# Patient Record
Sex: Female | Born: 1960
Health system: Southern US, Community
[De-identification: ages and names within clinical notes are randomized; demographics above are authoritative.]

## PROBLEM LIST (undated history)

## (undated) DIAGNOSIS — F4310 Post-traumatic stress disorder, unspecified: Secondary | ICD-10-CM

## (undated) DIAGNOSIS — F32A Depression, unspecified: Secondary | ICD-10-CM

## (undated) DIAGNOSIS — Z90710 Acquired absence of both cervix and uterus: Secondary | ICD-10-CM

## (undated) DIAGNOSIS — IMO0002 Reserved for concepts with insufficient information to code with codable children: Secondary | ICD-10-CM

## (undated) DIAGNOSIS — F329 Major depressive disorder, single episode, unspecified: Secondary | ICD-10-CM

## (undated) DIAGNOSIS — Z720 Tobacco use: Secondary | ICD-10-CM

## (undated) DIAGNOSIS — Z8659 Personal history of other mental and behavioral disorders: Secondary | ICD-10-CM

## (undated) DIAGNOSIS — T8859XA Other complications of anesthesia, initial encounter: Secondary | ICD-10-CM

## (undated) HISTORY — DX: Acquired absence of both cervix and uterus: Z90.710

## (undated) HISTORY — PX: CHOLECYSTECTOMY: SHX55

## (undated) HISTORY — DX: Tobacco use: Z72.0

## (undated) HISTORY — PX: TUBAL LIGATION: SHX77

## (undated) HISTORY — PX: ABDOMINAL HYSTERECTOMY: SHX81

## (undated) HISTORY — PX: ABLATION ON ENDOMETRIOSIS: SHX5787

## (undated) HISTORY — PX: OOPHORECTOMY: SHX86

## (undated) HISTORY — DX: Personal history of other mental and behavioral disorders: Z86.59

---

## 2011-01-12 ENCOUNTER — Ambulatory Visit (HOSPITAL_BASED_OUTPATIENT_CLINIC_OR_DEPARTMENT_OTHER): Payer: Medicare Other | Admitting: Psychology

## 2011-01-12 DIAGNOSIS — F4310 Post-traumatic stress disorder, unspecified: Secondary | ICD-10-CM

## 2011-01-12 DIAGNOSIS — F332 Major depressive disorder, recurrent severe without psychotic features: Secondary | ICD-10-CM

## 2011-01-22 ENCOUNTER — Encounter (HOSPITAL_COMMUNITY): Payer: Medicare Other | Admitting: Psychology

## 2011-01-22 ENCOUNTER — Encounter (HOSPITAL_BASED_OUTPATIENT_CLINIC_OR_DEPARTMENT_OTHER): Payer: Medicare Other | Admitting: Psychology

## 2011-01-22 DIAGNOSIS — F4310 Post-traumatic stress disorder, unspecified: Secondary | ICD-10-CM

## 2011-02-05 ENCOUNTER — Encounter (HOSPITAL_COMMUNITY): Payer: Medicare Other | Admitting: Psychology

## 2011-02-05 ENCOUNTER — Encounter (HOSPITAL_BASED_OUTPATIENT_CLINIC_OR_DEPARTMENT_OTHER): Payer: Medicare Other | Admitting: Psychology

## 2011-02-05 DIAGNOSIS — F4310 Post-traumatic stress disorder, unspecified: Secondary | ICD-10-CM

## 2011-02-12 ENCOUNTER — Encounter (HOSPITAL_COMMUNITY): Payer: Medicare Other | Admitting: Psychology

## 2011-02-16 ENCOUNTER — Encounter (HOSPITAL_BASED_OUTPATIENT_CLINIC_OR_DEPARTMENT_OTHER): Payer: Medicare Other | Admitting: Psychology

## 2011-02-16 DIAGNOSIS — F4310 Post-traumatic stress disorder, unspecified: Secondary | ICD-10-CM

## 2011-02-23 ENCOUNTER — Encounter (HOSPITAL_BASED_OUTPATIENT_CLINIC_OR_DEPARTMENT_OTHER): Payer: Medicare Other | Admitting: Psychology

## 2011-02-23 DIAGNOSIS — F4310 Post-traumatic stress disorder, unspecified: Secondary | ICD-10-CM

## 2011-03-09 ENCOUNTER — Encounter (HOSPITAL_COMMUNITY): Payer: Medicare Other | Admitting: Psychology

## 2011-03-23 ENCOUNTER — Encounter (HOSPITAL_COMMUNITY): Payer: Medicare Other | Admitting: Psychology

## 2011-05-25 ENCOUNTER — Encounter (INDEPENDENT_AMBULATORY_CARE_PROVIDER_SITE_OTHER): Payer: Medicare Other | Admitting: Psychology

## 2011-05-25 DIAGNOSIS — F4310 Post-traumatic stress disorder, unspecified: Secondary | ICD-10-CM

## 2011-12-19 ENCOUNTER — Emergency Department (INDEPENDENT_AMBULATORY_CARE_PROVIDER_SITE_OTHER)
Admission: EM | Admit: 2011-12-19 | Discharge: 2011-12-19 | Disposition: A | Discharge status: Home / Self Care | Payer: Medicare Other | Source: Home / Self Care | Attending: Family Medicine | Admitting: Family Medicine

## 2011-12-19 ENCOUNTER — Encounter: Payer: Self-pay | Admitting: *Deleted

## 2011-12-19 DIAGNOSIS — H109 Unspecified conjunctivitis: Secondary | ICD-10-CM | POA: Diagnosis not present

## 2011-12-19 DIAGNOSIS — J069 Acute upper respiratory infection, unspecified: Secondary | ICD-10-CM | POA: Diagnosis not present

## 2011-12-19 HISTORY — DX: Reserved for concepts with insufficient information to code with codable children: IMO0002

## 2011-12-19 HISTORY — DX: Post-traumatic stress disorder, unspecified: F43.10

## 2011-12-19 MED ORDER — AZITHROMYCIN 250 MG PO TABS: ORAL_TABLET | ORAL | Status: AC

## 2011-12-19 MED ORDER — POLYMYXIN B-TRIMETHOPRIM 10000-0.1 UNIT/ML-% OP SOLN: 1.0000 [drp] | OPHTHALMIC | Status: AC

## 2011-12-19 NOTE — ED Notes (Signed)
Pt woke up today with both eyes "crusted shut" they are itching burning and feel "grainy."  Pt was exposed to pink eye from her family.
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
## 2011-12-19 NOTE — Discharge Instructions (Signed)
Take Mucinex D (guaifenesin with decongestant) twice daily for congestion, or Mucinex plus Sudafed.  Increase fluid intake, rest. May use Afrin nasal spray (or generic oxymetazoline) twice daily for about 5 days.  Also recommend using saline nasal spray several times daily and saline nasal irrigation (AYR is a common brand) Stop all antihistamines for now, and other non-prescription cough/cold preparations. May take Delsym Cough Suppressant at bedtime for nighttime cough.  Begin Azithromycin if not improving about 5 days or if persistent fever develops.

## 2012-12-26 ENCOUNTER — Encounter: Payer: Self-pay | Admitting: *Deleted

## 2012-12-26 ENCOUNTER — Emergency Department (INDEPENDENT_AMBULATORY_CARE_PROVIDER_SITE_OTHER)
Admission: EM | Admit: 2012-12-26 | Discharge: 2012-12-26 | Disposition: A | Discharge status: Home / Self Care | Payer: Medicare Other | Source: Home / Self Care | Attending: Family Medicine | Admitting: Family Medicine

## 2012-12-26 DIAGNOSIS — L304 Erythema intertrigo: Secondary | ICD-10-CM

## 2012-12-26 DIAGNOSIS — L538 Other specified erythematous conditions: Secondary | ICD-10-CM

## 2012-12-26 HISTORY — DX: Major depressive disorder, single episode, unspecified: F32.9

## 2012-12-26 HISTORY — DX: Depression, unspecified: F32.A

## 2012-12-26 MED ORDER — CICLOPIROX OLAMINE 0.77 % EX CREA: TOPICAL_CREAM | Freq: Two times a day (BID) | CUTANEOUS | Status: DC

## 2012-12-26 MED ORDER — CEPHALEXIN 500 MG PO CAPS: 500.0000 mg | ORAL_CAPSULE | Freq: Three times a day (TID) | ORAL | Status: DC
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
## 2012-12-26 NOTE — ED Notes (Signed)
Pt c/o redness to her LT lower abd x today.

## 2012-12-28 ENCOUNTER — Telehealth: Payer: Self-pay | Admitting: *Deleted

## 2015-09-11 ENCOUNTER — Telehealth: Payer: Self-pay | Admitting: Osteopathic Medicine

## 2015-09-11 ENCOUNTER — Encounter: Payer: Self-pay | Admitting: Osteopathic Medicine

## 2015-09-11 ENCOUNTER — Ambulatory Visit (INDEPENDENT_AMBULATORY_CARE_PROVIDER_SITE_OTHER): Payer: Medicare Other | Admitting: Osteopathic Medicine

## 2015-09-11 VITALS — BP 121/81 | HR 76 | Temp 98.3°F | Ht 64.0 in | Wt 194.0 lb

## 2015-09-11 DIAGNOSIS — Z113 Encounter for screening for infections with a predominantly sexual mode of transmission: Secondary | ICD-10-CM

## 2015-09-11 DIAGNOSIS — H6092 Unspecified otitis externa, left ear: Secondary | ICD-10-CM

## 2015-09-11 DIAGNOSIS — Z114 Encounter for screening for human immunodeficiency virus [HIV]: Secondary | ICD-10-CM

## 2015-09-11 DIAGNOSIS — Z6833 Body mass index (BMI) 33.0-33.9, adult: Secondary | ICD-10-CM

## 2015-09-11 DIAGNOSIS — Z818 Family history of other mental and behavioral disorders: Secondary | ICD-10-CM

## 2015-09-11 DIAGNOSIS — F329 Major depressive disorder, single episode, unspecified: Secondary | ICD-10-CM

## 2015-09-11 DIAGNOSIS — F418 Other specified anxiety disorders: Secondary | ICD-10-CM

## 2015-09-11 DIAGNOSIS — Z8659 Personal history of other mental and behavioral disorders: Secondary | ICD-10-CM | POA: Diagnosis not present

## 2015-09-11 DIAGNOSIS — Z1159 Encounter for screening for other viral diseases: Secondary | ICD-10-CM

## 2015-09-11 DIAGNOSIS — Z79899 Other long term (current) drug therapy: Secondary | ICD-10-CM

## 2015-09-11 DIAGNOSIS — F419 Anxiety disorder, unspecified: Secondary | ICD-10-CM

## 2015-09-11 DIAGNOSIS — Z90710 Acquired absence of both cervix and uterus: Secondary | ICD-10-CM | POA: Diagnosis not present

## 2015-09-11 DIAGNOSIS — Z720 Tobacco use: Secondary | ICD-10-CM

## 2015-09-11 DIAGNOSIS — F32A Depression, unspecified: Secondary | ICD-10-CM

## 2015-09-11 DIAGNOSIS — Z13220 Encounter for screening for lipoid disorders: Secondary | ICD-10-CM

## 2015-09-11 DIAGNOSIS — R7301 Impaired fasting glucose: Secondary | ICD-10-CM

## 2015-09-11 HISTORY — DX: Body mass index (BMI) 33.0-33.9, adult: Z68.33

## 2015-09-11 HISTORY — DX: Personal history of other mental and behavioral disorders: Z86.59

## 2015-09-11 HISTORY — DX: Tobacco use: Z72.0

## 2015-09-11 HISTORY — DX: Acquired absence of both cervix and uterus: Z90.710

## 2015-09-11 HISTORY — DX: Anxiety disorder, unspecified: F41.9

## 2015-09-11 HISTORY — DX: Family history of other mental and behavioral disorders: Z81.8

## 2015-09-11 HISTORY — DX: Depression, unspecified: F32.A

## 2015-09-11 MED ORDER — CIPROFLOXACIN-DEXAMETHASONE 0.3-0.1 % OT SUSP: 4.0000 [drp] | Freq: Two times a day (BID) | OTIC | Status: DC

## 2015-09-11 MED ORDER — NEOMYCIN-POLYMYXIN-HC 3.5-10000-1 OT SOLN: 3.0000 [drp] | Freq: Four times a day (QID) | OTIC | Status: DC

## 2015-09-11 NOTE — Telephone Encounter (Signed)
Pt states the ear drops she was prescribed today were over $100 at the pharmacy, wants to know if there is a cheaper option. Will route.

## 2015-09-11 NOTE — Telephone Encounter (Signed)
Called an alternative, please call patient and let her know, thank you
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
## 2015-09-12 DIAGNOSIS — E669 Obesity, unspecified: Secondary | ICD-10-CM | POA: Diagnosis not present

## 2015-09-12 DIAGNOSIS — Z113 Encounter for screening for infections with a predominantly sexual mode of transmission: Secondary | ICD-10-CM | POA: Diagnosis not present

## 2015-09-12 DIAGNOSIS — Z1159 Encounter for screening for other viral diseases: Secondary | ICD-10-CM | POA: Diagnosis not present

## 2015-09-12 DIAGNOSIS — F341 Dysthymic disorder: Secondary | ICD-10-CM | POA: Diagnosis not present

## 2015-09-12 DIAGNOSIS — F418 Other specified anxiety disorders: Secondary | ICD-10-CM | POA: Diagnosis not present

## 2015-09-12 DIAGNOSIS — Z7189 Other specified counseling: Secondary | ICD-10-CM | POA: Diagnosis not present

## 2015-09-12 DIAGNOSIS — Z13220 Encounter for screening for lipoid disorders: Secondary | ICD-10-CM | POA: Diagnosis not present

## 2015-09-12 DIAGNOSIS — Z79899 Other long term (current) drug therapy: Secondary | ICD-10-CM | POA: Diagnosis not present

## 2015-09-12 DIAGNOSIS — Z6833 Body mass index (BMI) 33.0-33.9, adult: Secondary | ICD-10-CM | POA: Diagnosis not present

## 2015-09-12 LAB — COMPLETE METABOLIC PANEL WITH GFR
ALBUMIN: 3.9 g/dL (ref 3.6–5.1)
ALK PHOS: 75 U/L (ref 33–130)
ALT: 17 U/L (ref 6–29)
AST: 20 U/L (ref 10–35)
BILIRUBIN TOTAL: 0.5 mg/dL (ref 0.2–1.2)
BUN: 22 mg/dL (ref 7–25)
CALCIUM: 9.2 mg/dL (ref 8.6–10.4)
CO2: 27 mmol/L (ref 20–31)
Chloride: 105 mmol/L (ref 98–110)
Creat: 0.77 mg/dL (ref 0.50–1.05)
GFR, EST NON AFRICAN AMERICAN: 88 mL/min (ref >=60)
GFR, Est African American: >89 mL/min (ref >=60)
GLUCOSE: 110 mg/dL — AB (ref 65–99)
POTASSIUM: 4.2 mmol/L (ref 3.5–5.3)
Sodium: 140 mmol/L (ref 135–146)
TOTAL PROTEIN: 6.7 g/dL (ref 6.1–8.1)

## 2015-09-12 LAB — CBC WITH DIFFERENTIAL/PLATELET
Basophils Absolute: 0.1 10*3/uL (ref 0.0–0.1)
Basophils Relative: 1 % (ref 0–1)
Eosinophils Absolute: 0.4 10*3/uL (ref 0.0–0.7)
Eosinophils Relative: 6 % — ABNORMAL HIGH (ref 0–5)
HEMATOCRIT: 41.8 % (ref 36.0–46.0)
HEMOGLOBIN: 13.8 g/dL (ref 12.0–15.0)
LYMPHS ABS: 2.1 10*3/uL (ref 0.7–4.0)
LYMPHS PCT: 30 % (ref 12–46)
MCH: 29.7 pg (ref 26.0–34.0)
MCHC: 33 g/dL (ref 30.0–36.0)
MCV: 89.9 fL (ref 78.0–100.0)
MONO ABS: 0.5 10*3/uL (ref 0.1–1.0)
MONOS PCT: 7 % (ref 3–12)
MPV: 9.9 fL (ref 8.6–12.4)
NEUTROS ABS: 3.9 10*3/uL (ref 1.7–7.7)
NEUTROS PCT: 56 % (ref 43–77)
Platelets: 210 10*3/uL (ref 150–400)
RBC: 4.65 MIL/uL (ref 3.87–5.11)
RDW: 16.2 % — ABNORMAL HIGH (ref 11.5–15.5)
WBC: 7 10*3/uL (ref 4.0–10.5)

## 2015-09-12 LAB — LIPID PANEL
CHOLESTEROL: 240 mg/dL — AB (ref 125–200)
HDL: 77 mg/dL (ref >=46)
LDL CALC: 140 mg/dL — AB (ref <130)
TRIGLYCERIDES: 116 mg/dL (ref <150)
Total CHOL/HDL Ratio: 3.1 Ratio (ref <=5.0)
VLDL: 23 mg/dL (ref <30)

## 2015-09-12 NOTE — Telephone Encounter (Signed)
Left information on Pt's voicemail.

## 2015-09-13 LAB — HIV ANTIBODY (ROUTINE TESTING W REFLEX): HIV 1&2 Ab, 4th Generation: NONREACTIVE

## 2015-09-13 LAB — HEPATITIS C ANTIBODY: HCV AB: NEGATIVE

## 2015-09-13 LAB — TSH: TSH: 2.956 u[IU]/mL (ref 0.350–4.500)

## 2015-09-13 LAB — RPR

## 2015-09-16 ENCOUNTER — Other Ambulatory Visit (HOSPITAL_COMMUNITY)
Admission: RE | Admit: 2015-09-16 | Discharge: 2015-09-16 | Disposition: A | Discharge status: Home / Self Care | Payer: Medicare Other | Source: Ambulatory Visit | Attending: Osteopathic Medicine | Admitting: Osteopathic Medicine

## 2015-09-16 ENCOUNTER — Ambulatory Visit (INDEPENDENT_AMBULATORY_CARE_PROVIDER_SITE_OTHER): Payer: Medicare Other | Admitting: Osteopathic Medicine

## 2015-09-16 ENCOUNTER — Encounter: Payer: Self-pay | Admitting: Osteopathic Medicine

## 2015-09-16 VITALS — BP 108/69 | HR 66 | Wt 190.0 lb

## 2015-09-16 DIAGNOSIS — R7301 Impaired fasting glucose: Secondary | ICD-10-CM | POA: Diagnosis not present

## 2015-09-16 DIAGNOSIS — Z1151 Encounter for screening for human papillomavirus (HPV): Secondary | ICD-10-CM | POA: Insufficient documentation

## 2015-09-16 DIAGNOSIS — Z124 Encounter for screening for malignant neoplasm of cervix: Secondary | ICD-10-CM

## 2015-09-16 DIAGNOSIS — Z1239 Encounter for other screening for malignant neoplasm of breast: Secondary | ICD-10-CM

## 2015-09-16 DIAGNOSIS — Z8619 Personal history of other infectious and parasitic diseases: Secondary | ICD-10-CM

## 2015-09-16 DIAGNOSIS — E559 Vitamin D deficiency, unspecified: Secondary | ICD-10-CM

## 2015-09-16 DIAGNOSIS — Z780 Asymptomatic menopausal state: Secondary | ICD-10-CM | POA: Diagnosis not present

## 2015-09-16 DIAGNOSIS — Z23 Encounter for immunization: Secondary | ICD-10-CM | POA: Diagnosis not present

## 2015-09-16 DIAGNOSIS — Z01419 Encounter for gynecological examination (general) (routine) without abnormal findings: Secondary | ICD-10-CM | POA: Insufficient documentation

## 2015-09-16 DIAGNOSIS — Z0000 Encounter for general adult medical examination without abnormal findings: Secondary | ICD-10-CM | POA: Diagnosis not present

## 2015-09-16 MED ORDER — AMBULATORY NON FORMULARY MEDICATION: Status: DC

## 2015-09-16 NOTE — Addendum Note (Signed)
Addended by: Maryla Morrow on: 09/16/2015 08:50 AM   Modules accepted: Orders
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
## 2015-09-17 ENCOUNTER — Ambulatory Visit (INDEPENDENT_AMBULATORY_CARE_PROVIDER_SITE_OTHER): Payer: Medicare Other

## 2015-09-17 DIAGNOSIS — Z780 Asymptomatic menopausal state: Secondary | ICD-10-CM | POA: Diagnosis not present

## 2015-09-17 DIAGNOSIS — M8580 Other specified disorders of bone density and structure, unspecified site: Secondary | ICD-10-CM | POA: Diagnosis not present

## 2015-09-17 DIAGNOSIS — Z1231 Encounter for screening mammogram for malignant neoplasm of breast: Secondary | ICD-10-CM | POA: Diagnosis not present

## 2015-09-17 LAB — HEMOGLOBIN A1C
Hgb A1c MFr Bld: 5.9 % — ABNORMAL HIGH (ref <5.7)
Mean Plasma Glucose: 123 mg/dL — ABNORMAL HIGH (ref <117)

## 2015-09-17 LAB — VITAMIN D 25 HYDROXY (VIT D DEFICIENCY, FRACTURES): VIT D 25 HYDROXY: 54 ng/mL (ref 30–100)

## 2015-09-19 LAB — CYTOLOGY - PAP

## 2015-10-07 ENCOUNTER — Ambulatory Visit (INDEPENDENT_AMBULATORY_CARE_PROVIDER_SITE_OTHER): Payer: Medicare Other | Admitting: Psychiatry

## 2015-10-07 ENCOUNTER — Encounter (HOSPITAL_COMMUNITY): Payer: Self-pay | Admitting: Psychiatry

## 2015-10-07 VITALS — BP 126/68 | HR 80 | Ht 64.0 in | Wt 195.0 lb

## 2015-10-07 DIAGNOSIS — F332 Major depressive disorder, recurrent severe without psychotic features: Secondary | ICD-10-CM | POA: Diagnosis not present

## 2015-10-07 DIAGNOSIS — F411 Generalized anxiety disorder: Secondary | ICD-10-CM

## 2015-10-07 MED ORDER — OXCARBAZEPINE 150 MG PO TABS: 150.0000 mg | ORAL_TABLET | Freq: Two times a day (BID) | ORAL | Status: DC

## 2015-10-07 MED ORDER — FLUOXETINE HCL 10 MG PO TABS: 20.0000 mg | ORAL_TABLET | Freq: Every day | ORAL | Status: DC
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
## 2015-10-13 DIAGNOSIS — Z1212 Encounter for screening for malignant neoplasm of rectum: Secondary | ICD-10-CM | POA: Diagnosis not present

## 2015-10-13 DIAGNOSIS — Z1211 Encounter for screening for malignant neoplasm of colon: Secondary | ICD-10-CM | POA: Diagnosis not present

## 2015-10-15 NOTE — Addendum Note (Signed)
Addended by: Maryla Morrow on: 10/15/2015 10:31 AM   Modules accepted: Level of Service

## 2015-10-24 ENCOUNTER — Telehealth: Payer: Self-pay | Admitting: Osteopathic Medicine

## 2015-10-24 LAB — COLOGUARD: COLOGUARD: NEGATIVE

## 2015-10-24 NOTE — Telephone Encounter (Signed)
Left message on patient vm with results as noted below. Jessica Lang,CMA

## 2015-10-24 NOTE — Telephone Encounter (Signed)
Please call patient: Cologuard test was negative as screening for colon cancer, we'll need to repeat again in 3 years.

## 2015-10-28 ENCOUNTER — Ambulatory Visit (INDEPENDENT_AMBULATORY_CARE_PROVIDER_SITE_OTHER): Payer: Medicare Other | Admitting: Psychiatry

## 2015-10-28 ENCOUNTER — Encounter (HOSPITAL_COMMUNITY): Payer: Self-pay | Admitting: Psychiatry

## 2015-10-28 ENCOUNTER — Other Ambulatory Visit (HOSPITAL_COMMUNITY): Payer: Self-pay | Admitting: Psychiatry

## 2015-10-28 VITALS — BP 124/76 | HR 86 | Ht 64.0 in | Wt 200.0 lb

## 2015-10-28 DIAGNOSIS — F411 Generalized anxiety disorder: Secondary | ICD-10-CM | POA: Diagnosis not present

## 2015-10-28 DIAGNOSIS — F332 Major depressive disorder, recurrent severe without psychotic features: Secondary | ICD-10-CM

## 2015-10-28 DIAGNOSIS — G4700 Insomnia, unspecified: Secondary | ICD-10-CM | POA: Diagnosis not present

## 2015-10-28 MED ORDER — OXCARBAZEPINE 150 MG PO TABS: 150.0000 mg | ORAL_TABLET | Freq: Three times a day (TID) | ORAL | Status: DC

## 2015-10-28 MED ORDER — FLUOXETINE HCL 20 MG PO TABS: 20.0000 mg | ORAL_TABLET | Freq: Every day | ORAL | Status: DC
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
## 2015-11-10 NOTE — Telephone Encounter (Signed)
Received medication request from [facility] for Prozac 10mg . Per Dr. De Nurse, pt is authorized for a refill Prozac 10mg , #60. Prescription was sent to pharmacy. Pt is schedule for a f/u appt on 11/25/15. Called and informed pt of prescription status. Pt verbalizes understanding.

## 2015-11-25 ENCOUNTER — Ambulatory Visit (INDEPENDENT_AMBULATORY_CARE_PROVIDER_SITE_OTHER): Payer: Medicare Other | Admitting: Psychiatry

## 2015-11-25 ENCOUNTER — Encounter (HOSPITAL_COMMUNITY): Payer: Self-pay | Admitting: Psychiatry

## 2015-11-25 VITALS — BP 124/68 | HR 75 | Ht 64.0 in | Wt 206.0 lb

## 2015-11-25 DIAGNOSIS — F332 Major depressive disorder, recurrent severe without psychotic features: Secondary | ICD-10-CM

## 2015-11-25 DIAGNOSIS — G4700 Insomnia, unspecified: Secondary | ICD-10-CM

## 2015-11-25 DIAGNOSIS — F411 Generalized anxiety disorder: Secondary | ICD-10-CM | POA: Diagnosis not present

## 2015-11-25 MED ORDER — OXCARBAZEPINE 150 MG PO TABS: 150.0000 mg | ORAL_TABLET | Freq: Three times a day (TID) | ORAL | Status: DC

## 2015-11-25 MED ORDER — FLUOXETINE HCL 10 MG PO TABS: 20.0000 mg | ORAL_TABLET | Freq: Every day | ORAL | Status: DC
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
## 2015-12-18 ENCOUNTER — Other Ambulatory Visit (HOSPITAL_COMMUNITY): Payer: Self-pay | Admitting: Psychiatry

## 2015-12-18 NOTE — Telephone Encounter (Signed)
Received medication request from [facility] for Prozac. Per Dr. De Nurse, medication request is denied. Refill was sent to pharmacy on 11/25/15 for Prozac 10mg , # 60 w/ 1 refill. Pt is schedule for a f/u appt on 01/20/16.

## 2015-12-20 ENCOUNTER — Other Ambulatory Visit (HOSPITAL_COMMUNITY): Payer: Self-pay | Admitting: Psychiatry

## 2015-12-22 NOTE — Telephone Encounter (Signed)
[facility] Per Dr. De Nurse, medication request is denied. Rx for Prozac 10mg , #60 was sent to pharmacy on 11/25/15 w/ 1 refill. Pt has request medication refill too early. Pt is schedule for a f/u appt on 01/20/16. Called and informed pt of rx status. Pt verbalizes understanding.

## 2016-01-19 ENCOUNTER — Encounter: Payer: Self-pay | Admitting: Family Medicine

## 2016-01-19 ENCOUNTER — Ambulatory Visit (INDEPENDENT_AMBULATORY_CARE_PROVIDER_SITE_OTHER): Payer: Medicare Other | Admitting: Family Medicine

## 2016-01-19 VITALS — BP 129/81 | HR 73 | Temp 98.6°F | Ht 64.0 in | Wt 224.0 lb

## 2016-01-19 DIAGNOSIS — H60399 Other infective otitis externa, unspecified ear: Secondary | ICD-10-CM | POA: Insufficient documentation

## 2016-01-19 DIAGNOSIS — H60393 Other infective otitis externa, bilateral: Secondary | ICD-10-CM

## 2016-01-19 HISTORY — DX: Other infective otitis externa, unspecified ear: H60.399

## 2016-01-19 MED ORDER — CEFDINIR 300 MG PO CAPS: 300.0000 mg | ORAL_CAPSULE | Freq: Two times a day (BID) | ORAL | Status: DC

## 2016-01-19 MED ORDER — NEOMYCIN-POLYMYXIN-HC 3.5-10000-1 OT SOLN: 3.0000 [drp] | Freq: Four times a day (QID) | OTIC | Status: DC
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
## 2016-01-19 NOTE — Patient Instructions (Signed)
Thank you for coming in today. Use ear drops in both ears.  If not better take the oral antibiotics.   Otitis Externa Otitis externa is a bacterial or fungal infection of the outer ear canal. This is the area from the eardrum to the outside of the ear. Otitis externa is sometimes called "swimmer's ear." CAUSES  Possible causes of infection include:  Swimming in dirty water.  Moisture remaining in the ear after swimming or bathing.  Mild injury (trauma) to the ear.  Objects stuck in the ear (foreign body).  Cuts or scrapes (abrasions) on the outside of the ear. SIGNS AND SYMPTOMS  The first symptom of infection is often itching in the ear canal. Later signs and symptoms may include swelling and redness of the ear canal, ear pain, and yellowish-white fluid (pus) coming from the ear. The ear pain may be worse when pulling on the earlobe. DIAGNOSIS  Your health care provider will perform a physical exam. A sample of fluid may be taken from the ear and examined for bacteria or fungi. TREATMENT  Antibiotic ear drops are often given for 10 to 14 days. Treatment may also include pain medicine or corticosteroids to reduce itching and swelling. HOME CARE INSTRUCTIONS   Apply antibiotic ear drops to the ear canal as prescribed by your health care provider.  Take medicines only as directed by your health care provider.  If you have diabetes, follow any additional treatment instructions from your health care provider.  Keep all follow-up visits as directed by your health care provider. PREVENTION   Keep your ear dry. Use the corner of a towel to absorb water out of the ear canal after swimming or bathing.  Avoid scratching or putting objects inside your ear. This can damage the ear canal or remove the protective wax that lines the canal. This makes it easier for bacteria and fungi to grow.  Avoid swimming in lakes, polluted water, or poorly chlorinated pools.  You may use ear drops made  of rubbing alcohol and vinegar after swimming. Combine equal parts of white vinegar and alcohol in a bottle. Put 3 or 4 drops into each ear after swimming. SEEK MEDICAL CARE IF:   You have a fever.  Your ear is still red, swollen, painful, or draining pus after 3 days.  Your redness, swelling, or pain gets worse.  You have a severe headache.  You have redness, swelling, pain, or tenderness in the area behind your ear. MAKE SURE YOU:   Understand these instructions.  Will watch your condition.  Will get help right away if you are not doing well or get worse.   This information is not intended to replace advice given to you by your health care provider. Make sure you discuss any questions you have with your health care provider.   Document Released: 08/30/2005 Document Revised: 09/20/2014 Document Reviewed: 09/16/2011 Elsevier Interactive Patient Education [employer].

## 2016-01-20 ENCOUNTER — Ambulatory Visit (INDEPENDENT_AMBULATORY_CARE_PROVIDER_SITE_OTHER): Payer: Medicare Other | Admitting: Psychiatry

## 2016-01-20 ENCOUNTER — Encounter (HOSPITAL_COMMUNITY): Payer: Self-pay | Admitting: Psychiatry

## 2016-01-20 VITALS — BP 126/80 | HR 72 | Ht 64.0 in | Wt 222.0 lb

## 2016-01-20 DIAGNOSIS — F332 Major depressive disorder, recurrent severe without psychotic features: Secondary | ICD-10-CM | POA: Diagnosis not present

## 2016-01-20 DIAGNOSIS — F411 Generalized anxiety disorder: Secondary | ICD-10-CM | POA: Diagnosis not present

## 2016-01-20 DIAGNOSIS — G4700 Insomnia, unspecified: Secondary | ICD-10-CM | POA: Diagnosis not present

## 2016-01-20 MED ORDER — FLUOXETINE HCL 10 MG PO TABS: 20.0000 mg | ORAL_TABLET | Freq: Every day | ORAL | Status: DC

## 2016-01-20 MED ORDER — OXCARBAZEPINE 150 MG PO TABS: 150.0000 mg | ORAL_TABLET | Freq: Three times a day (TID) | ORAL | Status: DC
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
## 2016-03-04 ENCOUNTER — Encounter: Payer: Self-pay | Admitting: Emergency Medicine

## 2016-03-04 ENCOUNTER — Emergency Department (INDEPENDENT_AMBULATORY_CARE_PROVIDER_SITE_OTHER)
Admission: EM | Admit: 2016-03-04 | Discharge: 2016-03-04 | Disposition: A | Discharge status: Home / Self Care | Payer: Medicare Other | Source: Home / Self Care | Attending: Family Medicine | Admitting: Family Medicine

## 2016-03-04 ENCOUNTER — Emergency Department (INDEPENDENT_AMBULATORY_CARE_PROVIDER_SITE_OTHER): Payer: Medicare Other

## 2016-03-04 DIAGNOSIS — R0602 Shortness of breath: Secondary | ICD-10-CM

## 2016-03-04 DIAGNOSIS — R600 Localized edema: Secondary | ICD-10-CM

## 2016-03-04 DIAGNOSIS — M7989 Other specified soft tissue disorders: Secondary | ICD-10-CM

## 2016-03-04 MED ORDER — FUROSEMIDE 20 MG PO TABS: 20.0000 mg | ORAL_TABLET | Freq: Every day | ORAL | Status: DC

## 2016-03-04 NOTE — Discharge Instructions (Signed)
Edema Edema is an abnormal buildup of fluids. It is more common in your legs and thighs. Painless swelling of the feet and ankles is more likely as a person ages. It also is common in looser skin, like around your eyes. HOME CARE   Keep the affected body part above the level of the heart while lying down.  Do not sit still or stand for a long time.  Do not put anything right under your knees when you lie down.  Do not wear tight clothes on your upper legs.  Exercise your legs to help the puffiness (swelling) go down.  Wear elastic bandages or support stockings as told by your doctor.  A low-salt diet may help lessen the puffiness.  Only take medicine as told by your doctor. GET HELP IF:  Treatment is not working.  You have heart, liver, or kidney disease and notice that your skin looks puffy or shiny.  You have puffiness in your legs that does not get better when you raise your legs.  You have sudden weight gain for no reason. GET HELP RIGHT AWAY IF:   You have shortness of breath or chest pain.  You cannot breathe when you lie down.  You have pain, redness, or warmth in the areas that are puffy.  You have heart, liver, or kidney disease and get edema all of a sudden.  You have a fever and your symptoms get worse all of a sudden. MAKE SURE YOU:   Understand these instructions.  Will watch your condition.  Will get help right away if you are not doing well or get worse.   This information is not intended to replace advice given to you by your health care provider. Make sure you discuss any questions you have with your health care provider.   Document Released: 02/16/2008 Document Revised: 09/04/2013 Document Reviewed: 06/22/2013 Elsevier Interactive Patient Education 2016 Else[employer]  Peripheral Edema You have swelling in your legs (peripheral edema). This swelling is due to excess accumulation of salt and water in your body. Edema may be a sign of heart, kidney  or liver disease, or a side effect of a medication. It may also be due to problems in the leg veins. Elevating your legs and using special support stockings may be very helpful, if the cause of the swelling is due to poor venous circulation. Avoid long periods of standing, whatever the cause. Treatment of edema depends on identifying the cause. Chips, pretzels, pickles and other salty foods should be avoided. Restricting salt in your diet is almost always needed. Water pills (diuretics) are often used to remove the excess salt and water from your body via urine. These medicines prevent the kidney from reabsorbing sodium. This increases urine flow. Diuretic treatment may also result in lowering of potassium levels in your body. Potassium supplements may be needed if you have to use diuretics daily. Daily weights can help you keep track of your progress in clearing your edema. You should call your caregiver for follow up care as recommended. SEEK IMMEDIATE MEDICAL CARE IF:   You have increased swelling, pain, redness, or heat in your legs.  You develop shortness of breath, especially when lying down.  You develop chest or abdominal pain, weakness, or fainting.  You have a fever.   This information is not intended to replace advice given to you by your health care provider. Make sure you discuss any questions you have with your health care provider.   Document Released:  10/07/2004 Document Revised: 11/22/2011 Document Reviewed: 03/12/2015 Elsevier Interactive Patient Education [employer].
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
## 2016-03-04 NOTE — ED Notes (Signed)
Legs swelling since yesterday and SOB x 2 days, staes she has gained 45 lbs in 6 months since she quit smoking

## 2016-03-08 ENCOUNTER — Encounter: Payer: Self-pay | Admitting: Osteopathic Medicine

## 2016-03-08 ENCOUNTER — Ambulatory Visit (INDEPENDENT_AMBULATORY_CARE_PROVIDER_SITE_OTHER): Payer: Medicare Other | Admitting: Osteopathic Medicine

## 2016-03-08 VITALS — BP 133/80 | HR 76 | Temp 98.1°F | Wt 222.0 lb

## 2016-03-08 DIAGNOSIS — R5382 Chronic fatigue, unspecified: Secondary | ICD-10-CM

## 2016-03-08 DIAGNOSIS — Z79899 Other long term (current) drug therapy: Secondary | ICD-10-CM | POA: Diagnosis not present

## 2016-03-08 DIAGNOSIS — M7989 Other specified soft tissue disorders: Secondary | ICD-10-CM | POA: Diagnosis not present

## 2016-03-08 NOTE — Patient Instructions (Signed)
Due for annual physical next January, 2018. Come see me sooner if any problems!
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
## 2016-03-09 LAB — URINALYSIS
BILIRUBIN URINE: NEGATIVE
GLUCOSE, UA: NEGATIVE
HGB URINE DIPSTICK: NEGATIVE
Ketones, ur: NEGATIVE
LEUKOCYTES UA: NEGATIVE
Nitrite: NEGATIVE
PROTEIN: NEGATIVE
Specific Gravity, Urine: 1.024 (ref 1.001–1.035)
pH: 7.5 (ref 5.0–8.0)

## 2016-03-09 LAB — CBC
HCT: 39.9 % (ref 35.0–45.0)
HEMOGLOBIN: 13 g/dL (ref 11.7–15.5)
MCH: 31 pg (ref 27.0–33.0)
MCHC: 32.6 g/dL (ref 32.0–36.0)
MCV: 95 fL (ref 80.0–100.0)
MPV: 10 fL (ref 7.5–12.5)
PLATELETS: 230 10*3/uL (ref 140–400)
RBC: 4.2 MIL/uL (ref 3.80–5.10)
RDW: 14.8 % (ref 11.0–15.0)
WBC: 6.4 10*3/uL (ref 3.8–10.8)

## 2016-03-09 LAB — COMPLETE METABOLIC PANEL WITH GFR
ALBUMIN: 4.2 g/dL (ref 3.6–5.1)
ALK PHOS: 75 U/L (ref 33–130)
ALT: 33 U/L — ABNORMAL HIGH (ref 6–29)
AST: 27 U/L (ref 10–35)
BILIRUBIN TOTAL: 0.3 mg/dL (ref 0.2–1.2)
BUN: 20 mg/dL (ref 7–25)
CALCIUM: 9 mg/dL (ref 8.6–10.4)
CO2: 27 mmol/L (ref 20–31)
Chloride: 103 mmol/L (ref 98–110)
Creat: 0.78 mg/dL (ref 0.50–1.05)
GFR, EST NON AFRICAN AMERICAN: 87 mL/min (ref >=60)
GLUCOSE: 104 mg/dL — AB (ref 65–99)
POTASSIUM: 4.4 mmol/L (ref 3.5–5.3)
Sodium: 138 mmol/L (ref 135–146)
TOTAL PROTEIN: 6.5 g/dL (ref 6.1–8.1)

## 2016-03-09 LAB — TSH: TSH: 2.86 m[IU]/L

## 2016-03-25 ENCOUNTER — Telehealth (HOSPITAL_COMMUNITY): Payer: Self-pay | Admitting: *Deleted

## 2016-03-25 NOTE — Telephone Encounter (Signed)
Received fax from [facility] requesting a refill on Fluoxteine. Per Dr. De Nurse, refill denied. Medication was escribed to pharmacy on 01/20/16 with 2 refills. Refill request is too early. Pt f/u appt is 04/16/16. Faxed response to [facility] at [phone].

## 2016-04-14 ENCOUNTER — Other Ambulatory Visit (HOSPITAL_COMMUNITY): Payer: Self-pay | Admitting: Psychiatry

## 2016-04-16 ENCOUNTER — Encounter (HOSPITAL_COMMUNITY): Payer: Self-pay | Admitting: Psychiatry

## 2016-04-16 ENCOUNTER — Ambulatory Visit (INDEPENDENT_AMBULATORY_CARE_PROVIDER_SITE_OTHER): Payer: Medicare Other | Admitting: Psychiatry

## 2016-04-16 VITALS — BP 122/68 | HR 72 | Ht 64.0 in | Wt 228.0 lb

## 2016-04-16 DIAGNOSIS — F411 Generalized anxiety disorder: Secondary | ICD-10-CM

## 2016-04-16 DIAGNOSIS — F332 Major depressive disorder, recurrent severe without psychotic features: Secondary | ICD-10-CM | POA: Diagnosis not present

## 2016-04-16 DIAGNOSIS — G4700 Insomnia, unspecified: Secondary | ICD-10-CM | POA: Diagnosis not present

## 2016-04-16 MED ORDER — OXCARBAZEPINE 150 MG PO TABS: 150.0000 mg | ORAL_TABLET | Freq: Three times a day (TID) | ORAL | 2 refills | Status: DC

## 2016-04-16 MED ORDER — FLUOXETINE HCL 10 MG PO TABS: 20.0000 mg | ORAL_TABLET | Freq: Every day | ORAL | 2 refills | Status: DC
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
## 2016-04-16 NOTE — Patient Instructions (Signed)
Will refer for sleep study. History of snoring. And tiredness

## 2016-05-13 ENCOUNTER — Other Ambulatory Visit (HOSPITAL_COMMUNITY): Payer: Self-pay | Admitting: Psychiatry

## 2016-05-14 NOTE — Telephone Encounter (Signed)
Received fax from [facility] requesting a refill for Prozac. Per Dr. De Nurse, refill denied. Medication was escribed to pharmacy on 04/16/16 with 2 refills. Refill request is too early. Pt f/u appt is 07/19/16. Faxed response to [facility] at [phone].

## 2016-06-09 ENCOUNTER — Emergency Department (INDEPENDENT_AMBULATORY_CARE_PROVIDER_SITE_OTHER): Payer: Medicare Other

## 2016-06-09 ENCOUNTER — Encounter: Payer: Self-pay | Admitting: *Deleted

## 2016-06-09 ENCOUNTER — Emergency Department (INDEPENDENT_AMBULATORY_CARE_PROVIDER_SITE_OTHER)
Admission: EM | Admit: 2016-06-09 | Discharge: 2016-06-09 | Disposition: A | Discharge status: Home / Self Care | Payer: Medicare Other | Source: Home / Self Care | Attending: Emergency Medicine | Admitting: Emergency Medicine

## 2016-06-09 DIAGNOSIS — R079 Chest pain, unspecified: Secondary | ICD-10-CM

## 2016-06-09 DIAGNOSIS — R0602 Shortness of breath: Secondary | ICD-10-CM

## 2016-06-09 DIAGNOSIS — J45901 Unspecified asthma with (acute) exacerbation: Secondary | ICD-10-CM

## 2016-06-09 DIAGNOSIS — R059 Cough, unspecified: Secondary | ICD-10-CM

## 2016-06-09 DIAGNOSIS — R062 Wheezing: Secondary | ICD-10-CM

## 2016-06-09 DIAGNOSIS — R05 Cough: Secondary | ICD-10-CM

## 2016-06-09 DIAGNOSIS — R070 Pain in throat: Secondary | ICD-10-CM

## 2016-06-09 MED ORDER — PREDNISONE 10 MG PO TABS: ORAL_TABLET | ORAL | 0 refills | Status: DC

## 2016-06-09 MED ORDER — IPRATROPIUM-ALBUTEROL 0.5-2.5 (3) MG/3ML IN SOLN
3.0000 mL | RESPIRATORY_TRACT | Status: AC
  Administered 2016-06-09: 3 mL via RESPIRATORY_TRACT

## 2016-06-09 MED ORDER — ALBUTEROL SULFATE HFA 108 (90 BASE) MCG/ACT IN AERS: 2.0000 | INHALATION_SPRAY | RESPIRATORY_TRACT | 0 refills | Status: DC | PRN

## 2016-06-09 MED ORDER — METHYLPREDNISOLONE ACETATE 80 MG/ML IJ SUSP
80.0000 mg | Freq: Once | INTRAMUSCULAR | Status: AC
  Administered 2016-06-09: 80 mg via INTRAMUSCULAR
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
## 2016-06-09 NOTE — ED Triage Notes (Signed)
Pt c/o cough and SOB after "running over a puff ball mushroom with my lawn mower 2 days and breathing in the spores".

## 2016-06-12 ENCOUNTER — Emergency Department (INDEPENDENT_AMBULATORY_CARE_PROVIDER_SITE_OTHER)
Admission: EM | Admit: 2016-06-12 | Discharge: 2016-06-12 | Disposition: A | Discharge status: Home / Self Care | Payer: Medicare Other | Source: Home / Self Care | Attending: Family Medicine | Admitting: Family Medicine

## 2016-06-12 ENCOUNTER — Encounter: Payer: Self-pay | Admitting: Emergency Medicine

## 2016-06-12 DIAGNOSIS — J209 Acute bronchitis, unspecified: Secondary | ICD-10-CM | POA: Diagnosis not present

## 2016-06-12 DIAGNOSIS — J45901 Unspecified asthma with (acute) exacerbation: Secondary | ICD-10-CM

## 2016-06-12 MED ORDER — AZITHROMYCIN 250 MG PO TABS: ORAL_TABLET | ORAL | 0 refills | Status: DC

## 2016-06-12 MED ORDER — BENZONATATE 200 MG PO CAPS: 200.0000 mg | ORAL_CAPSULE | Freq: Every day | ORAL | 0 refills | Status: DC

## 2016-06-12 MED ORDER — PREDNISONE 20 MG PO TABS: 20.0000 mg | ORAL_TABLET | Freq: Two times a day (BID) | ORAL | 0 refills | Status: DC

## 2016-06-12 MED ORDER — FLUTICASONE-SALMETEROL 100-50 MCG/DOSE IN AEPB: 1.0000 | INHALATION_SPRAY | Freq: Two times a day (BID) | RESPIRATORY_TRACT | 1 refills | Status: DC

## 2016-06-12 MED ORDER — IPRATROPIUM-ALBUTEROL 0.5-2.5 (3) MG/3ML IN SOLN: 3.0000 mL | Freq: Once | RESPIRATORY_TRACT | Status: DC

## 2016-06-12 NOTE — ED Triage Notes (Signed)
Patient was here for wheezing 3 days ago and it has not improved despite using her rescue inhaler as ordered. She believes condition started after inhaling mushroom spores while mowing grass 3 days ago.

## 2016-06-12 NOTE — Discharge Instructions (Signed)
Begin new prednisone prescription Sunday 06/13/16 (after five days begin taking two ten mg tabs once daily for 3 days). Take plain guaifenesin (1200mg  extended release tabs such as Mucinex) twice daily, with plenty of water, for cough and congestion.  May add Pseudoephedrine (30mg , one or two every 4 to 6 hours) for sinus congestion.  Get adequate rest.   May use Afrin nasal spray (or generic oxymetazoline) twice daily for about 5 days and then discontinue.  Also recommend using saline nasal spray several times daily and saline nasal irrigation (AYR is a common brand).  Use Flonase nasal spray each morning after using Afrin nasal spray and saline nasal irrigation. Try warm salt water gargles for sore throat.  Stop all antihistamines for now, and other non-prescription cough/cold preparations. Continue albuterol inhaler. Follow-up with family doctor.
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
## 2016-06-17 ENCOUNTER — Ambulatory Visit (INDEPENDENT_AMBULATORY_CARE_PROVIDER_SITE_OTHER): Payer: Medicare Other | Admitting: Osteopathic Medicine

## 2016-06-17 ENCOUNTER — Encounter: Payer: Self-pay | Admitting: Osteopathic Medicine

## 2016-06-17 ENCOUNTER — Ambulatory Visit (INDEPENDENT_AMBULATORY_CARE_PROVIDER_SITE_OTHER): Payer: Medicare Other

## 2016-06-17 VITALS — BP 117/95 | HR 75 | Temp 98.4°F | Ht 64.0 in | Wt 234.0 lb

## 2016-06-17 DIAGNOSIS — Z8709 Personal history of other diseases of the respiratory system: Secondary | ICD-10-CM | POA: Diagnosis not present

## 2016-06-17 DIAGNOSIS — R05 Cough: Secondary | ICD-10-CM | POA: Diagnosis not present

## 2016-06-17 DIAGNOSIS — J984 Other disorders of lung: Secondary | ICD-10-CM | POA: Diagnosis not present

## 2016-06-17 DIAGNOSIS — I700 Atherosclerosis of aorta: Secondary | ICD-10-CM | POA: Diagnosis not present

## 2016-06-17 DIAGNOSIS — R059 Cough, unspecified: Secondary | ICD-10-CM

## 2016-06-17 MED ORDER — GUAIFENESIN-CODEINE 100-10 MG/5ML PO SYRP: 5.0000 mL | ORAL_SOLUTION | Freq: Three times a day (TID) | ORAL | 0 refills | Status: DC | PRN

## 2016-06-17 MED ORDER — NEBULIZER/TUBING/MOUTHPIECE KIT: PACK | 1 refills | Status: DC

## 2016-06-17 MED ORDER — NEBULIZER DEVI: 1 refills | Status: DC
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
## 2016-06-17 NOTE — Patient Instructions (Signed)
It can be difficult to determine whether your persistent cough is due to lingering inflammation from an asthma exacerbation or viral bronchitis, postnasal drip/irritation from an upper respiratory infection or allergic illness, or something else. If not getting better, may need to do further testing.  Chest x-ray is reassuring that there doesn't look like any pneumonia or rib fracture, a radiologist will look at these images and I will call you if they say anything different about it.   In the meantime, continue the prednisone taper that you're on, continue the inhalers that you're taking. Continue the cough medication that was given to you by urgent care, I have added an additional cough syrup to take in the evening when you're having difficulty sleeping. Otherwise, look for lozenges with benzocaine and menthol in these to help numb the throat and reduce irritation, can use as much honey as you want, herbal teas with elm bark, marshmallow root, licorice root are a bit more helpful in coding the throat to reduce irritation.  Any questions or concerns, or if your cough is getting worse particularly if there is also a fever or productive cough, or if you are not experiencing any improvement over the course of the next 1-2 weeks, please return to the office for further evaluation.

## 2016-06-18 ENCOUNTER — Telehealth: Payer: Self-pay

## 2016-06-18 MED ORDER — IPRATROPIUM-ALBUTEROL 0.5-2.5 (3) MG/3ML IN SOLN: 3.0000 mL | RESPIRATORY_TRACT | 1 refills | Status: DC | PRN

## 2016-06-18 NOTE — Telephone Encounter (Signed)
Patient called stated that she got the Rx for Nebulizer machine but she did not get a Rx for the neb solution. Please advise patient request a Rx for Nebulizer solution sent to [facility]. Jessica Lang

## 2016-06-18 NOTE — Telephone Encounter (Signed)
Rx sent to pharmacy   

## 2016-06-21 ENCOUNTER — Telehealth: Payer: Self-pay | Admitting: *Deleted

## 2016-06-21 DIAGNOSIS — J449 Chronic obstructive pulmonary disease, unspecified: Secondary | ICD-10-CM

## 2016-06-21 NOTE — Telephone Encounter (Signed)
Submitted PA for gen Duoneb Key: [id number] - PA Case ID: [id number]

## 2016-06-28 ENCOUNTER — Other Ambulatory Visit: Payer: Self-pay | Admitting: Osteopathic Medicine

## 2016-06-28 DIAGNOSIS — J441 Chronic obstructive pulmonary disease with (acute) exacerbation: Secondary | ICD-10-CM | POA: Insufficient documentation

## 2016-06-28 DIAGNOSIS — J209 Acute bronchitis, unspecified: Secondary | ICD-10-CM

## 2016-06-28 DIAGNOSIS — J849 Interstitial pulmonary disease, unspecified: Secondary | ICD-10-CM | POA: Insufficient documentation

## 2016-06-28 HISTORY — DX: Interstitial pulmonary disease, unspecified: J84.9

## 2016-06-28 HISTORY — DX: Chronic obstructive pulmonary disease with (acute) exacerbation: J44.1

## 2016-06-28 HISTORY — DX: Acute bronchitis, unspecified: J20.9

## 2016-06-28 MED ORDER — IPRATROPIUM-ALBUTEROL 0.5-2.5 (3) MG/3ML IN SOLN: 3.0000 mL | RESPIRATORY_TRACT | 1 refills | Status: DC | PRN

## 2016-06-28 NOTE — Progress Notes (Signed)
J20.9, J84.9, J44.1  Receives noticed that DuoNeb medication was denied, recent prescription with diagnostic codes associated per pharmacy request/instructions this was the problem

## 2016-07-01 NOTE — Telephone Encounter (Signed)
Upon f/u found that duoneb was : keyDeniedon October 9  Your request has been denied. Patient has rescue inhalers however perhaps you would like to send another neb solution since this was denied for future and since she does have the neb machine ?

## 2016-07-05 ENCOUNTER — Other Ambulatory Visit (HOSPITAL_COMMUNITY): Payer: Self-pay | Admitting: Psychiatry

## 2016-07-07 DIAGNOSIS — J449 Chronic obstructive pulmonary disease, unspecified: Secondary | ICD-10-CM

## 2016-07-07 HISTORY — DX: Chronic obstructive pulmonary disease, unspecified: J44.9

## 2016-07-07 NOTE — Telephone Encounter (Signed)
I'm not sure I ordered it under the correct diagnosis. Can we put DuoNeb under the following diagnoses if that will fix the PA?  J84.9 chronic interstitial lung disease J44.9 COPD J44.1 COPD exacerbation

## 2016-07-08 NOTE — Telephone Encounter (Signed)
Medication refill- received fax from [facility] requesting a refill for Trileptal. Per Dr. De Nurse, refill is authorized for Trileptal 150mg , #90. Rx was sent to pharmacy. Pt f/u appt is schedule for 11/6. Called and informed pt of refill status. Pt verbalizes understand.

## 2016-07-14 NOTE — Telephone Encounter (Signed)
When I did the PA one of the questions asked if patient needed the med for specific dx and it included several dx codes. I answered yes to question in regard to the diagnosis.  Does the patient require albuterol, arformoterol, budesonide, cromolyn, formoterol, ipratropium, ipratropium/albuterol, or levalbuterol for the management of asthma or obstructive pulmonary disease (ICD-10 diagnosis codes J41.0, J41.1, J41.8, J42, J43.0-J43.2, J43.8, J43.9, J44.0, J44.1, J44.9, J45.20-J45.22, J45.30-J45.32, J45.40-J45.42, J45.50-J45.52, J45.901, J45.902, J45.909, J45.990, J45.991, J45.998, J47.0, J47.1, J47.9, J60, J61, J62.0, J62.8, J63.0-J63.6, J64, J65, J66.0-J66.2, J66.8, J67.0-J67.9, J68.0-J68.4, J68.8, J68.9, J69.0, J69.1, J69.8, J70.0-J70.5, J70.8, J70.9 or ICD-9 diagnosis codes 491.0 - 508.9)? Tech Note: Process thru part D if the following exist: IF THE PATIENT TOOK THE REQUESTED MEDICATION ON HIS OWN IN AN OUTPATIENT SETTING (E.G. EMERGENCY ROOM, URGENT CARE FACILITY) FOR A REASON NOT RELATED TO THE VISIT, APPLY ONE TIME APPROVAL FOR DATE OF FILL.)   the only diagnosis that was not  included was j84.9 and an appeal sent through covermymed

## 2016-07-19 ENCOUNTER — Encounter (HOSPITAL_COMMUNITY): Payer: Self-pay | Admitting: Psychiatry

## 2016-07-19 ENCOUNTER — Ambulatory Visit (INDEPENDENT_AMBULATORY_CARE_PROVIDER_SITE_OTHER): Payer: Medicare Other | Admitting: Psychiatry

## 2016-07-19 ENCOUNTER — Encounter: Payer: Self-pay | Admitting: Osteopathic Medicine

## 2016-07-19 DIAGNOSIS — Z833 Family history of diabetes mellitus: Secondary | ICD-10-CM

## 2016-07-19 DIAGNOSIS — F332 Major depressive disorder, recurrent severe without psychotic features: Secondary | ICD-10-CM

## 2016-07-19 DIAGNOSIS — F5102 Adjustment insomnia: Secondary | ICD-10-CM

## 2016-07-19 DIAGNOSIS — F411 Generalized anxiety disorder: Secondary | ICD-10-CM | POA: Diagnosis not present

## 2016-07-19 DIAGNOSIS — Z87891 Personal history of nicotine dependence: Secondary | ICD-10-CM

## 2016-07-19 DIAGNOSIS — Z8249 Family history of ischemic heart disease and other diseases of the circulatory system: Secondary | ICD-10-CM

## 2016-07-19 DIAGNOSIS — Z8489 Family history of other specified conditions: Secondary | ICD-10-CM

## 2016-07-19 MED ORDER — FLUOXETINE HCL 20 MG PO CAPS: 20.0000 mg | ORAL_CAPSULE | Freq: Every day | ORAL | 2 refills | Status: DC

## 2016-07-19 MED ORDER — OXCARBAZEPINE 150 MG PO TABS: 150.0000 mg | ORAL_TABLET | Freq: Three times a day (TID) | ORAL | 2 refills | Status: DC
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
## 2016-07-30 NOTE — Telephone Encounter (Signed)
Thi

## 2016-08-03 ENCOUNTER — Encounter: Payer: Self-pay | Admitting: *Deleted

## 2016-08-03 NOTE — Telephone Encounter (Signed)
Upon f/u duoneb was denied because the medication was submitted under Medicare Part D. The medication needs to be submitted by the pharmacist under medicare part B. Since the patient is using the medication in a nebulizer in her home then it will be covered by medicare part B. I called and left this on the pharmacy voicemail. Medicare part D cant cover a drug  that is covered by Medicare Part B. Called the pharmacy and left this on their vm and also faxed to them the letter of explanation. Notified the patient as well.

## 2016-08-26 ENCOUNTER — Encounter: Payer: Self-pay | Admitting: Family Medicine

## 2016-08-26 ENCOUNTER — Ambulatory Visit (INDEPENDENT_AMBULATORY_CARE_PROVIDER_SITE_OTHER): Payer: Medicare Other | Admitting: Family Medicine

## 2016-08-26 VITALS — BP 126/88 | HR 79 | Wt 229.0 lb

## 2016-08-26 DIAGNOSIS — J849 Interstitial pulmonary disease, unspecified: Secondary | ICD-10-CM

## 2016-08-26 DIAGNOSIS — J441 Chronic obstructive pulmonary disease with (acute) exacerbation: Secondary | ICD-10-CM | POA: Diagnosis not present

## 2016-08-26 MED ORDER — PREDNISONE 50 MG PO TABS: 50.0000 mg | ORAL_TABLET | Freq: Every day | ORAL | 0 refills | Status: DC

## 2016-08-26 MED ORDER — BUDESONIDE 0.25 MG/2ML IN SUSP: 0.2500 mg | Freq: Two times a day (BID) | RESPIRATORY_TRACT | 12 refills | Status: DC

## 2016-08-26 MED ORDER — AZITHROMYCIN 250 MG PO TABS: 250.0000 mg | ORAL_TABLET | Freq: Every day | ORAL | 0 refills | Status: DC

## 2016-08-26 NOTE — Patient Instructions (Signed)
Thank you for coming in today. Take prednisone and azithromycin daily. Try using the inhaled steroid through the nebulizer if it's affordable. Follow up with Dr. Sheppard Coil soon Call or go to the emergency room if you get worse, have trouble breathing, have chest pains, or palpitations.     Budesonide inhalation solution What is this medicine? BUDESONIDE (bue DES oh nide) is a corticosteroid. It helps decrease inflammation in your lungs. This medicine is used to treat the symptoms of asthma. Never use this medicine for an acute asthma attack. This medicine may be used for other purposes; ask your health care provider or pharmacist if you have questions. COMMON BRAND NAME(S): Pulmicort What should I tell my health care provider before I take this medicine? They need to know if you have any of these conditions: -bone problems -glaucoma -immune system problems -infection, like chickenpox, tuberculosis, herpes, or fungal infection -recent surgery or injury of the mouth or throat -taking corticosteroids by mouth -an unusual or allergic reaction to budesonide, steroids, other medicines, foods, dyes, or preservatives -pregnant or trying to get pregnant -breast-feeding How should I use this medicine? This medicine is used in a nebulizer. Nebulizers make a liquid into an aerosol that you breathe in through your mouth or your mouth and nose into your lungs. You will be taught how to use your nebulizer. Rinse your mouth with water after use. Follow the directions on your prescription label. Do not mix this medicine with other medicines in your nebulizer. Do not use more often than directed. Talk to your pediatrician regarding the use of this medicine in children. Special care may be needed. Overdosage: If you think you have taken too much of this medicine contact a poison control center or emergency room at once. NOTE: This medicine is only for you. Do not share this medicine with others. What if I  miss a dose? If you miss a dose, use it as soon as you remember. If it is almost time for your next dose, use only that dose and continue with your regular schedule, spacing doses evenly. Do not use double or extra doses. What may interact with this medicine? Do not take this medicine with any of the following medications: -mifepristone This medicine may also interact with the following medications: -cimetidine -clarithromycin -erythromycin -itraconazole -ketoconazole -some vaccinations This list may not describe all possible interactions. Give your health care provider a list of all the medicines, herbs, non-prescription drugs, or dietary supplements you use. Also tell them if you smoke, drink alcohol, or use illegal drugs. Some items may interact with your medicine. What should I watch for while using this medicine? Visit your doctor or health care professional for regular checks on your progress. Check with your health care professional if your symptoms do not improve. If your symptoms get worse or if you need your short acting inhalers more often, call your doctor right away. The medicine may increase your risk of getting an infection. Stay away from people who are sick. Tell your doctor or health care professional if you are around anyone with measles or chickenpox. What side effects may I notice from receiving this medicine? Side effects that you should report to your doctor or health care professional as soon as possible: -allergic reactions like skin rash, itching or hives, swelling of the face, lips, or tongue -breathing problems -changes in vision -unusual swelling -white patches or sores in the mouth or throat Side effects that usually do not require medical attention (report to  your doctor or health care professional if they continue or are bothersome): -coughing, hoarseness -headache -runny nose -stomach upset This list may not describe all possible side effects. Call your  doctor for medical advice about side effects. You may report side effects to FDA at 1-800-FDA-1088. Where should I keep my medicine? Keep out of the reach of children. Store at a room temperature between 20 and 25 degrees C (68 and 77 degrees F). Do not refrigerate or freeze. Keep unopened vials in the foil pouch. When the package has been opened, the shelf life of the unused medicine is 2 weeks when protected from light. Unused medicine should be returned to the aluminum foil envelope right away to protect them from light. Throw away any unused medicine after the expiration date. NOTE: This sheet is a summary. It may not cover all possible information. If you have questions about this medicine, talk to your doctor, pharmacist, or health care provider.  2017 Elsevier/Gold Standard (2013-02-15 11:10:36)
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
## 2016-10-05 ENCOUNTER — Other Ambulatory Visit: Payer: Self-pay

## 2016-10-05 DIAGNOSIS — J849 Interstitial pulmonary disease, unspecified: Secondary | ICD-10-CM

## 2016-10-05 DIAGNOSIS — J441 Chronic obstructive pulmonary disease with (acute) exacerbation: Secondary | ICD-10-CM

## 2016-10-05 MED ORDER — BUDESONIDE 0.25 MG/2ML IN SUSP: 0.2500 mg | Freq: Two times a day (BID) | RESPIRATORY_TRACT | 12 refills | Status: DC

## 2016-10-12 ENCOUNTER — Ambulatory Visit (INDEPENDENT_AMBULATORY_CARE_PROVIDER_SITE_OTHER): Payer: Medicare Other | Admitting: Psychiatry

## 2016-10-12 ENCOUNTER — Encounter (HOSPITAL_COMMUNITY): Payer: Self-pay | Admitting: Psychiatry

## 2016-10-12 VITALS — BP 116/72 | HR 85 | Resp 16 | Ht 64.0 in | Wt 218.0 lb

## 2016-10-12 DIAGNOSIS — Z9889 Other specified postprocedural states: Secondary | ICD-10-CM | POA: Diagnosis not present

## 2016-10-12 DIAGNOSIS — F5102 Adjustment insomnia: Secondary | ICD-10-CM | POA: Diagnosis not present

## 2016-10-12 DIAGNOSIS — F411 Generalized anxiety disorder: Secondary | ICD-10-CM

## 2016-10-12 DIAGNOSIS — Z833 Family history of diabetes mellitus: Secondary | ICD-10-CM

## 2016-10-12 DIAGNOSIS — F332 Major depressive disorder, recurrent severe without psychotic features: Secondary | ICD-10-CM | POA: Diagnosis not present

## 2016-10-12 DIAGNOSIS — Z9049 Acquired absence of other specified parts of digestive tract: Secondary | ICD-10-CM

## 2016-10-12 DIAGNOSIS — Z818 Family history of other mental and behavioral disorders: Secondary | ICD-10-CM

## 2016-10-12 DIAGNOSIS — Z90710 Acquired absence of both cervix and uterus: Secondary | ICD-10-CM

## 2016-10-12 DIAGNOSIS — F17210 Nicotine dependence, cigarettes, uncomplicated: Secondary | ICD-10-CM

## 2016-10-12 MED ORDER — OXCARBAZEPINE 150 MG PO TABS: 150.0000 mg | ORAL_TABLET | Freq: Three times a day (TID) | ORAL | 2 refills | Status: DC

## 2016-10-12 MED ORDER — FLUOXETINE HCL 20 MG PO CAPS: 20.0000 mg | ORAL_CAPSULE | Freq: Every day | ORAL | 2 refills | Status: DC
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
## 2016-11-30 ENCOUNTER — Encounter: Payer: Self-pay | Admitting: Emergency Medicine

## 2016-11-30 ENCOUNTER — Emergency Department (INDEPENDENT_AMBULATORY_CARE_PROVIDER_SITE_OTHER)
Admission: EM | Admit: 2016-11-30 | Discharge: 2016-11-30 | Disposition: A | Discharge status: Home / Self Care | Payer: Medicare Other | Source: Home / Self Care | Attending: Family Medicine | Admitting: Family Medicine

## 2016-11-30 DIAGNOSIS — J4521 Mild intermittent asthma with (acute) exacerbation: Secondary | ICD-10-CM

## 2016-11-30 DIAGNOSIS — J209 Acute bronchitis, unspecified: Secondary | ICD-10-CM

## 2016-11-30 MED ORDER — AZITHROMYCIN 250 MG PO TABS: 250.0000 mg | ORAL_TABLET | Freq: Every day | ORAL | 0 refills | Status: DC

## 2016-11-30 MED ORDER — IPRATROPIUM-ALBUTEROL 0.5-2.5 (3) MG/3ML IN SOLN
3.0000 mL | Freq: Once | RESPIRATORY_TRACT | Status: AC
  Administered 2016-11-30: 3 mL via RESPIRATORY_TRACT

## 2016-11-30 MED ORDER — METHYLPREDNISOLONE SODIUM SUCC 40 MG IJ SOLR
80.0000 mg | Freq: Once | INTRAMUSCULAR | Status: AC
  Administered 2016-11-30: 80 mg via INTRAMUSCULAR

## 2016-11-30 MED ORDER — PREDNISONE 20 MG PO TABS: ORAL_TABLET | ORAL | 0 refills | Status: DC

## 2016-11-30 MED ORDER — BENZONATATE 100 MG PO CAPS: 100.0000 mg | ORAL_CAPSULE | Freq: Three times a day (TID) | ORAL | 0 refills | Status: DC

## 2016-11-30 NOTE — Discharge Instructions (Signed)
°  You were given a shot of solumedrol (a steroid) today to help with inflammation in your lungs to help you breath better and to help with your cough.  You have been prescribed 5 days of prednisone, an oral steroid.  You may start this medication tomorrow with breakfast.   ° °

## 2016-11-30 NOTE — ED Triage Notes (Signed)
Patient reports cough for about 10 days; non-productive; not relieved by use of inhaler nor nebulizer.
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
## 2016-12-16 DIAGNOSIS — J40 Bronchitis, not specified as acute or chronic: Secondary | ICD-10-CM | POA: Diagnosis not present

## 2017-01-04 ENCOUNTER — Encounter (HOSPITAL_COMMUNITY): Payer: Self-pay | Admitting: Psychiatry

## 2017-01-04 ENCOUNTER — Ambulatory Visit (INDEPENDENT_AMBULATORY_CARE_PROVIDER_SITE_OTHER): Payer: Medicare Other | Admitting: Psychiatry

## 2017-01-04 VITALS — BP 124/74 | HR 70 | Resp 16 | Ht 64.0 in | Wt 194.0 lb

## 2017-01-04 DIAGNOSIS — F17210 Nicotine dependence, cigarettes, uncomplicated: Secondary | ICD-10-CM

## 2017-01-04 DIAGNOSIS — Z818 Family history of other mental and behavioral disorders: Secondary | ICD-10-CM

## 2017-01-04 DIAGNOSIS — F5102 Adjustment insomnia: Secondary | ICD-10-CM | POA: Diagnosis not present

## 2017-01-04 DIAGNOSIS — F332 Major depressive disorder, recurrent severe without psychotic features: Secondary | ICD-10-CM | POA: Diagnosis not present

## 2017-01-04 DIAGNOSIS — Z79899 Other long term (current) drug therapy: Secondary | ICD-10-CM

## 2017-01-04 DIAGNOSIS — F411 Generalized anxiety disorder: Secondary | ICD-10-CM | POA: Diagnosis not present

## 2017-01-04 MED ORDER — FLUOXETINE HCL 20 MG PO CAPS: 20.0000 mg | ORAL_CAPSULE | Freq: Every day | ORAL | 3 refills | Status: DC

## 2017-01-04 MED ORDER — OXCARBAZEPINE 150 MG PO TABS: 150.0000 mg | ORAL_TABLET | Freq: Three times a day (TID) | ORAL | 3 refills | Status: DC
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
## 2017-04-26 ENCOUNTER — Encounter (HOSPITAL_COMMUNITY): Payer: Self-pay | Admitting: Psychiatry

## 2017-04-26 ENCOUNTER — Ambulatory Visit (INDEPENDENT_AMBULATORY_CARE_PROVIDER_SITE_OTHER): Payer: Medicare Other | Admitting: Psychiatry

## 2017-04-26 VITALS — BP 112/68 | HR 66 | Resp 16 | Ht 64.0 in | Wt 177.0 lb

## 2017-04-26 DIAGNOSIS — F332 Major depressive disorder, recurrent severe without psychotic features: Secondary | ICD-10-CM

## 2017-04-26 DIAGNOSIS — F5102 Adjustment insomnia: Secondary | ICD-10-CM | POA: Diagnosis not present

## 2017-04-26 DIAGNOSIS — Z818 Family history of other mental and behavioral disorders: Secondary | ICD-10-CM | POA: Diagnosis not present

## 2017-04-26 DIAGNOSIS — F411 Generalized anxiety disorder: Secondary | ICD-10-CM

## 2017-04-26 DIAGNOSIS — F17210 Nicotine dependence, cigarettes, uncomplicated: Secondary | ICD-10-CM

## 2017-04-26 MED ORDER — OXCARBAZEPINE 150 MG PO TABS: 150.0000 mg | ORAL_TABLET | Freq: Three times a day (TID) | ORAL | 3 refills | Status: DC

## 2017-04-26 MED ORDER — FLUOXETINE HCL 20 MG PO CAPS: 20.0000 mg | ORAL_CAPSULE | Freq: Every day | ORAL | 3 refills | Status: DC
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
## 2017-08-08 ENCOUNTER — Telehealth (HOSPITAL_COMMUNITY): Payer: Self-pay | Admitting: Psychiatry

## 2017-08-08 MED ORDER — OXCARBAZEPINE 150 MG PO TABS: 150.0000 mg | ORAL_TABLET | Freq: Three times a day (TID) | ORAL | 0 refills | Status: DC

## 2017-08-08 NOTE — Telephone Encounter (Signed)
Pt needs refill on trileptral sent to [facility] on [address] in [place].

## 2017-08-08 NOTE — Telephone Encounter (Signed)
sent 

## 2017-08-16 ENCOUNTER — Encounter (HOSPITAL_COMMUNITY): Payer: Self-pay | Admitting: Psychiatry

## 2017-08-16 ENCOUNTER — Ambulatory Visit (INDEPENDENT_AMBULATORY_CARE_PROVIDER_SITE_OTHER): Payer: Medicare Other | Admitting: Psychiatry

## 2017-08-16 ENCOUNTER — Other Ambulatory Visit: Payer: Self-pay

## 2017-08-16 VITALS — BP 106/80 | HR 68 | Ht 64.0 in | Wt 198.0 lb

## 2017-08-16 DIAGNOSIS — F17210 Nicotine dependence, cigarettes, uncomplicated: Secondary | ICD-10-CM

## 2017-08-16 DIAGNOSIS — Z818 Family history of other mental and behavioral disorders: Secondary | ICD-10-CM | POA: Diagnosis not present

## 2017-08-16 DIAGNOSIS — Z79899 Other long term (current) drug therapy: Secondary | ICD-10-CM

## 2017-08-16 DIAGNOSIS — F411 Generalized anxiety disorder: Secondary | ICD-10-CM

## 2017-08-16 DIAGNOSIS — F332 Major depressive disorder, recurrent severe without psychotic features: Secondary | ICD-10-CM

## 2017-08-16 DIAGNOSIS — F5102 Adjustment insomnia: Secondary | ICD-10-CM

## 2017-08-16 MED ORDER — FLUOXETINE HCL 20 MG PO CAPS: 20.0000 mg | ORAL_CAPSULE | Freq: Every day | ORAL | 3 refills | Status: DC

## 2017-08-16 MED ORDER — OXCARBAZEPINE 150 MG PO TABS: 150.0000 mg | ORAL_TABLET | Freq: Three times a day (TID) | ORAL | 2 refills | Status: DC
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
## 2017-12-04 ENCOUNTER — Other Ambulatory Visit (HOSPITAL_COMMUNITY): Payer: Self-pay | Admitting: Psychiatry

## 2017-12-06 ENCOUNTER — Encounter (HOSPITAL_COMMUNITY): Payer: Self-pay | Admitting: Psychiatry

## 2017-12-06 ENCOUNTER — Ambulatory Visit (INDEPENDENT_AMBULATORY_CARE_PROVIDER_SITE_OTHER): Payer: Medicare Other | Admitting: Psychiatry

## 2017-12-06 VITALS — BP 118/80 | HR 69 | Ht 64.0 in | Wt 199.0 lb

## 2017-12-06 DIAGNOSIS — F17210 Nicotine dependence, cigarettes, uncomplicated: Secondary | ICD-10-CM

## 2017-12-06 DIAGNOSIS — Z818 Family history of other mental and behavioral disorders: Secondary | ICD-10-CM | POA: Diagnosis not present

## 2017-12-06 DIAGNOSIS — F332 Major depressive disorder, recurrent severe without psychotic features: Secondary | ICD-10-CM

## 2017-12-06 DIAGNOSIS — F411 Generalized anxiety disorder: Secondary | ICD-10-CM | POA: Diagnosis not present

## 2017-12-06 DIAGNOSIS — G4700 Insomnia, unspecified: Secondary | ICD-10-CM | POA: Diagnosis not present

## 2017-12-06 MED ORDER — FLUOXETINE HCL 20 MG PO CAPS: 20.0000 mg | ORAL_CAPSULE | Freq: Every day | ORAL | 2 refills | Status: DC

## 2017-12-06 MED ORDER — OXCARBAZEPINE 150 MG PO TABS: 150.0000 mg | ORAL_TABLET | Freq: Three times a day (TID) | ORAL | 2 refills | Status: DC
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
## 2018-02-20 ENCOUNTER — Encounter: Payer: Self-pay | Admitting: Osteopathic Medicine

## 2018-02-20 ENCOUNTER — Ambulatory Visit (INDEPENDENT_AMBULATORY_CARE_PROVIDER_SITE_OTHER): Payer: Medicare Other | Admitting: Osteopathic Medicine

## 2018-02-20 VITALS — BP 115/73 | HR 46 | Temp 98.2°F | Wt 192.6 lb

## 2018-02-20 DIAGNOSIS — R5382 Chronic fatigue, unspecified: Secondary | ICD-10-CM

## 2018-02-20 DIAGNOSIS — J449 Chronic obstructive pulmonary disease, unspecified: Secondary | ICD-10-CM | POA: Diagnosis not present

## 2018-02-20 DIAGNOSIS — R001 Bradycardia, unspecified: Secondary | ICD-10-CM | POA: Diagnosis not present

## 2018-02-20 DIAGNOSIS — R7303 Prediabetes: Secondary | ICD-10-CM | POA: Diagnosis not present

## 2018-02-20 DIAGNOSIS — B349 Viral infection, unspecified: Secondary | ICD-10-CM

## 2018-02-20 DIAGNOSIS — R112 Nausea with vomiting, unspecified: Secondary | ICD-10-CM | POA: Diagnosis not present

## 2018-02-20 DIAGNOSIS — R21 Rash and other nonspecific skin eruption: Secondary | ICD-10-CM | POA: Diagnosis not present

## 2018-02-20 DIAGNOSIS — Z13220 Encounter for screening for lipoid disorders: Secondary | ICD-10-CM

## 2018-02-20 DIAGNOSIS — E559 Vitamin D deficiency, unspecified: Secondary | ICD-10-CM | POA: Diagnosis not present
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
## 2018-02-21 ENCOUNTER — Encounter: Payer: Self-pay | Admitting: Osteopathic Medicine

## 2018-02-21 DIAGNOSIS — R5382 Chronic fatigue, unspecified: Secondary | ICD-10-CM

## 2018-02-21 HISTORY — DX: Chronic fatigue, unspecified: R53.82

## 2018-02-21 LAB — CBC WITH DIFFERENTIAL/PLATELET
BASOS PCT: 0.4 %
Basophils Absolute: 27 cells/uL (ref 0–200)
Eosinophils Absolute: 258 cells/uL (ref 15–500)
Eosinophils Relative: 3.8 %
HCT: 44.1 % (ref 35.0–45.0)
Hemoglobin: 15.3 g/dL (ref 11.7–15.5)
Lymphs Abs: 1700 cells/uL (ref 850–3900)
MCH: 32.8 pg (ref 27.0–33.0)
MCHC: 34.7 g/dL (ref 32.0–36.0)
MCV: 94.6 fL (ref 80.0–100.0)
MPV: 9.9 fL (ref 7.5–12.5)
Monocytes Relative: 6.6 %
NEUTROS PCT: 64.2 %
Neutro Abs: 4366 cells/uL (ref 1500–7800)
PLATELETS: 216 10*3/uL (ref 140–400)
RBC: 4.66 10*6/uL (ref 3.80–5.10)
RDW: 12.6 % (ref 11.0–15.0)
TOTAL LYMPHOCYTE: 25 %
WBC mixed population: 449 cells/uL (ref 200–950)
WBC: 6.8 10*3/uL (ref 3.8–10.8)

## 2018-02-21 LAB — VITAMIN D 25 HYDROXY (VIT D DEFICIENCY, FRACTURES): VIT D 25 HYDROXY: 50 ng/mL (ref 30–100)

## 2018-02-21 LAB — LIPID PANEL
CHOLESTEROL: 194 mg/dL (ref <200)
HDL: 59 mg/dL (ref >50)
LDL Cholesterol (Calc): 109 mg/dL (calc) — ABNORMAL HIGH
Non-HDL Cholesterol (Calc): 135 mg/dL (calc) — ABNORMAL HIGH (ref <130)
TRIGLYCERIDES: 146 mg/dL (ref <150)
Total CHOL/HDL Ratio: 3.3 (calc) (ref <5.0)

## 2018-02-21 LAB — COMPLETE METABOLIC PANEL WITH GFR
AG RATIO: 2 (calc) (ref 1.0–2.5)
ALT: 18 U/L (ref 6–29)
AST: 22 U/L (ref 10–35)
Albumin: 4.4 g/dL (ref 3.6–5.1)
Alkaline phosphatase (APISO): 75 U/L (ref 33–130)
BILIRUBIN TOTAL: 0.2 mg/dL (ref 0.2–1.2)
BUN: 17 mg/dL (ref 7–25)
CHLORIDE: 103 mmol/L (ref 98–110)
CO2: 28 mmol/L (ref 20–32)
Calcium: 9.6 mg/dL (ref 8.6–10.4)
Creat: 0.62 mg/dL (ref 0.50–1.05)
GFR, EST AFRICAN AMERICAN: 118 mL/min/{1.73_m2} (ref >=60)
GFR, Est Non African American: 102 mL/min/{1.73_m2} (ref >=60)
Globulin: 2.2 g/dL (calc) (ref 1.9–3.7)
Glucose, Bld: 85 mg/dL (ref 65–139)
POTASSIUM: 4.5 mmol/L (ref 3.5–5.3)
Sodium: 140 mmol/L (ref 135–146)
TOTAL PROTEIN: 6.6 g/dL (ref 6.1–8.1)

## 2018-02-21 LAB — TSH: TSH: 1.47 m[IU]/L

## 2018-02-21 LAB — HEMOGLOBIN A1C
EAG (MMOL/L): 6 (calc)
Hgb A1c MFr Bld: 5.4 % of total Hgb (ref <5.7)
Mean Plasma Glucose: 108 (calc)

## 2018-03-02 ENCOUNTER — Other Ambulatory Visit (HOSPITAL_COMMUNITY): Payer: Self-pay | Admitting: Psychiatry

## 2018-03-02 ENCOUNTER — Telehealth: Payer: Self-pay

## 2018-03-02 NOTE — Telephone Encounter (Signed)
Pt called requesting an update on referral for Cardiologist.

## 2018-03-03 NOTE — Telephone Encounter (Signed)
[facility] [place] has tried to call patient she just needs to call and schedule appointment - CF

## 2018-03-03 NOTE — Telephone Encounter (Signed)
Thanks for the update

## 2018-03-17 ENCOUNTER — Encounter: Payer: Self-pay | Admitting: Cardiology

## 2018-03-17 ENCOUNTER — Ambulatory Visit (INDEPENDENT_AMBULATORY_CARE_PROVIDER_SITE_OTHER): Payer: Medicare Other | Admitting: Cardiology

## 2018-03-17 ENCOUNTER — Encounter: Payer: Self-pay | Admitting: *Deleted

## 2018-03-17 VITALS — BP 98/62 | HR 66 | Ht 64.0 in | Wt 182.0 lb

## 2018-03-17 DIAGNOSIS — F17210 Nicotine dependence, cigarettes, uncomplicated: Secondary | ICD-10-CM

## 2018-03-17 DIAGNOSIS — J431 Panlobular emphysema: Secondary | ICD-10-CM

## 2018-03-17 DIAGNOSIS — R0600 Dyspnea, unspecified: Secondary | ICD-10-CM

## 2018-03-17 DIAGNOSIS — Z8659 Personal history of other mental and behavioral disorders: Secondary | ICD-10-CM

## 2018-03-17 DIAGNOSIS — Z720 Tobacco use: Secondary | ICD-10-CM | POA: Diagnosis not present

## 2018-03-17 DIAGNOSIS — R0609 Other forms of dyspnea: Secondary | ICD-10-CM

## 2018-03-17 DIAGNOSIS — I498 Other specified cardiac arrhythmias: Secondary | ICD-10-CM | POA: Diagnosis not present

## 2018-03-17 HISTORY — DX: Dyspnea, unspecified: R06.00

## 2018-03-17 HISTORY — DX: Other forms of dyspnea: R06.09

## 2018-03-17 HISTORY — DX: Other specified cardiac arrhythmias: I49.8

## 2018-03-17 NOTE — Patient Instructions (Signed)
Medication Instructions:  Your physician recommends that you continue on your current medications as directed. Please refer to the Current Medication list given to you today.   Labwork: None  Testing/Procedures: Your physician has requested that you have an echocardiogram. Echocardiography is a painless test that uses sound waves to create images of your heart. It provides your doctor with information about the size and shape of your heart and how well your heart's chambers and valves are working. This procedure takes approximately one hour. There are no restrictions for this procedure.  Your physician has requested that you have en exercise stress myoview. For further information please visit [email or web address]. Please follow instruction sheet, as given.  Your physician has recommended that you wear a holter monitor. Holter monitors are medical devices that record the heart's electrical activity. Doctors most often use these monitors to diagnose arrhythmias. Arrhythmias are problems with the speed or rhythm of the heartbeat. The monitor is a small, portable device. You can wear one while you do your normal daily activities. This is usually used to diagnose what is causing palpitations/syncope (passing out). Wear for 48 hours.   Follow-Up: Your physician wants you to follow-up in: 6 months. You will receive a reminder letter in the mail two months in advance. If you don't receive a letter, please call our office to schedule the follow-up appointment.   If you need a refill on your cardiac medications before your next appointment, please call your pharmacy.    Echocardiogram An echocardiogram, or echocardiography, uses sound waves (ultrasound) to produce an image of your heart. The echocardiogram is simple, painless, obtained within a short period of time, and offers valuable information to your health care provider. The images from an echocardiogram can provide information such  as:  Evidence of coronary artery disease (CAD).  Heart size.  Heart muscle function.  Heart valve function.  Aneurysm detection.  Evidence of a past heart attack.  Fluid buildup around the heart.  Heart muscle thickening.  Assess heart valve function.  Tell a health care provider about:  Any allergies you have.  All medicines you are taking, including vitamins, herbs, eye drops, creams, and over-the-counter medicines.  Any problems you or family members have had with anesthetic medicines.  Any blood disorders you have.  Any surgeries you have had.  Any medical conditions you have.  Whether you are pregnant or may be pregnant. What happens before the procedure? No special preparation is needed. Eat and drink normally. What happens during the procedure?  In order to produce an image of your heart, gel will be applied to your chest and a wand-like tool (transducer) will be moved over your chest. The gel will help transmit the sound waves from the transducer. The sound waves will harmlessly bounce off your heart to allow the heart images to be captured in real-time motion. These images will then be recorded.  You may need an IV to receive a medicine that improves the quality of the pictures. What happens after the procedure? You may return to your normal schedule including diet, activities, and medicines, unless your health care provider tells you otherwise. This information is not intended to replace advice given to you by your health care provider. Make sure you discuss any questions you have with your health care provider. Document Released: 08/27/2000 Document Revised: 04/17/2016 Document Reviewed: 05/07/2013 Elsevier Interactive Patient Education  2017 [place].      Exercise Stress Electrocardiogram An exercise stress electrocardiogram is a test to  check how blood flows to your heart. It is done to find areas of poor blood flow. You will need to walk on a  treadmill for this test. The electrocardiogram will record your heartbeat when you are at rest and when you are exercising. What happens before the procedure?  Do not have drinks with caffeine or foods with caffeine for 24 hours before the test, or as told by your doctor. This includes coffee, tea (even decaf tea), sodas, chocolate, and cocoa.  Follow your doctor's instructions about eating and drinking before the test.  Ask your doctor what medicines you should or should not take before the test. Take your medicines with water unless told by your doctor not to.  If you use an inhaler, bring it with you to the test.  Bring a snack to eat after the test.  Do not  smoke for 4 hours before the test.  Do not put lotions, powders, creams, or oils on your chest before the test.  Wear comfortable shoes and clothing. What happens during the procedure?  You will have patches put on your chest. Small areas of your chest may need to be shaved. Wires will be connected to the patches.  Your heart rate will be watched while you are resting and while you are exercising.  You will walk on the treadmill. The treadmill will slowly get faster to raise your heart rate.  The test will take about 1-2 hours. What happens after the procedure?  Your heart rate and blood pressure will be watched after the test.  You may return to your normal diet, activities, and medicines or as told by your doctor. This information is not intended to replace advice given to you by your health care provider. Make sure you discuss any questions you have with your health care provider. Document Released: 02/16/2008 Document Revised: 04/28/2016 Document Reviewed: 05/07/2013 Elsevier Interactive Patient Education  2018 [place].     Holter Monitoring A Holter monitor is a small device that is used to detect abnormal heart rhythms. It clips to your clothing and is connected by wires to flat, sticky disks (electrodes)  that attach to your chest. It is worn continuously for 24-48 hours. Follow these instructions at home:  Wear your Holter monitor at all times, even while exercising and sleeping, for as long as directed by your health care provider.  Make sure that the Holter monitor is safely clipped to your clothing or close to your body as recommended by your health care provider.  Do not get the monitor or wires wet.  Do not put body lotion or moisturizer on your chest.  Keep your skin clean.  Keep a diary of your daily activities, such as walking and doing chores. If you feel that your heartbeat is abnormal or that your heart is fluttering or skipping a beat: ? Record what you are doing when it happens. ? Record what time of day the symptoms occur.  Return your Holter monitor as directed by your health care provider.  Keep all follow-up visits as directed by your health care provider. This is important. Get help right away if:  You feel lightheaded or you faint.  You have trouble breathing.  You feel pain in your chest, upper arm, or jaw.  You feel sick to your stomach and your skin is pale, cool, or damp.  You heartbeat feels unusual or abnormal. This information is not intended to replace advice given to you by your health care  provider. Make sure you discuss any questions you have with your health care provider. Document Released: 05/28/2004 Document Revised: 02/05/2016 Document Reviewed: 04/08/2014 Elsevier Interactive Patient Education  [employer].
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
## 2018-03-20 ENCOUNTER — Other Ambulatory Visit (HOSPITAL_COMMUNITY): Payer: Self-pay | Admitting: Psychiatry

## 2018-03-24 ENCOUNTER — Telehealth (HOSPITAL_COMMUNITY): Payer: Self-pay | Admitting: *Deleted

## 2018-03-24 NOTE — Telephone Encounter (Signed)
Patient given detailed instructions per Myocardial Perfusion Study Information Sheet for the test on 03/28/18. Patient notified to arrive 15 minutes early and that it is imperative to arrive on time for appointment to keep from having the test rescheduled.  If you need to cancel or reschedule your appointment, please call the office within 24 hours of your appointment. . Patient verbalized understanding. Jessica Lang    

## 2018-03-28 ENCOUNTER — Ambulatory Visit (INDEPENDENT_AMBULATORY_CARE_PROVIDER_SITE_OTHER): Payer: Medicare Other

## 2018-03-28 ENCOUNTER — Other Ambulatory Visit: Payer: Self-pay | Admitting: Cardiology

## 2018-03-28 ENCOUNTER — Other Ambulatory Visit: Payer: Self-pay

## 2018-03-28 ENCOUNTER — Ambulatory Visit (HOSPITAL_BASED_OUTPATIENT_CLINIC_OR_DEPARTMENT_OTHER): Payer: Medicare Other

## 2018-03-28 ENCOUNTER — Ambulatory Visit (HOSPITAL_COMMUNITY): Payer: Medicare Other | Attending: Cardiology

## 2018-03-28 VITALS — Ht 64.0 in | Wt 182.0 lb

## 2018-03-28 DIAGNOSIS — I071 Rheumatic tricuspid insufficiency: Secondary | ICD-10-CM | POA: Insufficient documentation

## 2018-03-28 DIAGNOSIS — R001 Bradycardia, unspecified: Secondary | ICD-10-CM

## 2018-03-28 DIAGNOSIS — R0609 Other forms of dyspnea: Secondary | ICD-10-CM | POA: Diagnosis not present

## 2018-03-28 DIAGNOSIS — I498 Other specified cardiac arrhythmias: Secondary | ICD-10-CM

## 2018-03-28 DIAGNOSIS — R5383 Other fatigue: Secondary | ICD-10-CM

## 2018-03-28 DIAGNOSIS — R110 Nausea: Secondary | ICD-10-CM

## 2018-03-28 DIAGNOSIS — I509 Heart failure, unspecified: Secondary | ICD-10-CM | POA: Insufficient documentation

## 2018-03-28 DIAGNOSIS — Z720 Tobacco use: Secondary | ICD-10-CM | POA: Insufficient documentation

## 2018-03-28 LAB — MYOCARDIAL PERFUSION IMAGING
CHL CUP NUCLEAR SDS: 4
CHL CUP NUCLEAR SRS: 7
CHL CUP NUCLEAR SSS: 11
LV dias vol: 102 mL (ref 46–106)
LV sys vol: 51 mL
Peak HR: 126 {beats}/min
RATE: 0.44
Rest HR: 62 {beats}/min
TID: 1.1

## 2018-03-28 LAB — ECHOCARDIOGRAM COMPLETE
Height: 64 in
Weight: 2912 oz

## 2018-03-28 MED ORDER — AMINOPHYLLINE 25 MG/ML IV SOLN
75.0000 mg | Freq: Once | INTRAVENOUS | Status: AC
  Administered 2018-03-28: 75 mg via INTRAVENOUS

## 2018-03-28 MED ORDER — REGADENOSON 0.4 MG/5ML IV SOLN
0.4000 mg | Freq: Once | INTRAVENOUS | Status: AC
  Administered 2018-03-28: 0.4 mg via INTRAVENOUS

## 2018-03-28 MED ORDER — TECHNETIUM TC 99M TETROFOSMIN IV KIT
10.8000 | PACK | Freq: Once | INTRAVENOUS | Status: AC | PRN
  Administered 2018-03-28: 10.8 via INTRAVENOUS
  Filled 2018-03-28: qty 11

## 2018-03-28 MED ORDER — TECHNETIUM TC 99M TETROFOSMIN IV KIT
30.7000 | PACK | Freq: Once | INTRAVENOUS | Status: AC | PRN
  Administered 2018-03-28: 30.7 via INTRAVENOUS
  Filled 2018-03-28: qty 31

## 2018-03-29 ENCOUNTER — Encounter (INDEPENDENT_AMBULATORY_CARE_PROVIDER_SITE_OTHER): Payer: Self-pay

## 2018-03-29 ENCOUNTER — Other Ambulatory Visit (HOSPITAL_COMMUNITY): Payer: Self-pay | Admitting: Psychiatry

## 2018-03-31 ENCOUNTER — Other Ambulatory Visit: Payer: Self-pay

## 2018-03-31 ENCOUNTER — Ambulatory Visit (INDEPENDENT_AMBULATORY_CARE_PROVIDER_SITE_OTHER): Payer: Medicare Other | Admitting: Psychiatry

## 2018-03-31 ENCOUNTER — Encounter (HOSPITAL_COMMUNITY): Payer: Self-pay | Admitting: Psychiatry

## 2018-03-31 VITALS — BP 136/80 | HR 60 | Ht 64.0 in | Wt 177.0 lb

## 2018-03-31 DIAGNOSIS — F5102 Adjustment insomnia: Secondary | ICD-10-CM | POA: Diagnosis not present

## 2018-03-31 DIAGNOSIS — F332 Major depressive disorder, recurrent severe without psychotic features: Secondary | ICD-10-CM

## 2018-03-31 DIAGNOSIS — F411 Generalized anxiety disorder: Secondary | ICD-10-CM

## 2018-03-31 MED ORDER — OXCARBAZEPINE 150 MG PO TABS: 150.0000 mg | ORAL_TABLET | Freq: Three times a day (TID) | ORAL | 0 refills | Status: DC

## 2018-03-31 MED ORDER — FLUOXETINE HCL 20 MG PO CAPS: ORAL_CAPSULE | ORAL | 1 refills | Status: DC
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
## 2018-04-04 ENCOUNTER — Ambulatory Visit (HOSPITAL_COMMUNITY): Payer: Self-pay | Admitting: Psychiatry

## 2018-04-06 ENCOUNTER — Telehealth (HOSPITAL_COMMUNITY): Payer: Self-pay

## 2018-04-06 ENCOUNTER — Other Ambulatory Visit (HOSPITAL_COMMUNITY): Payer: Self-pay

## 2018-04-06 MED ORDER — FLUOXETINE HCL 20 MG PO CAPS: ORAL_CAPSULE | ORAL | 0 refills | Status: DC

## 2018-04-06 NOTE — Telephone Encounter (Signed)
Pharmacy sent fax requesting a 90 day for prozac. Changed rx to a 90 day. Nothing further is needed at this time.

## 2018-05-03 ENCOUNTER — Encounter: Payer: Self-pay | Admitting: Osteopathic Medicine

## 2018-05-31 ENCOUNTER — Other Ambulatory Visit (HOSPITAL_COMMUNITY): Payer: Self-pay | Admitting: Psychiatry

## 2018-06-19 ENCOUNTER — Other Ambulatory Visit (HOSPITAL_COMMUNITY): Payer: Self-pay | Admitting: Psychiatry

## 2018-07-24 ENCOUNTER — Ambulatory Visit (HOSPITAL_COMMUNITY): Payer: Self-pay | Admitting: Psychiatry

## 2018-07-26 ENCOUNTER — Other Ambulatory Visit: Payer: Self-pay

## 2018-07-26 ENCOUNTER — Encounter (HOSPITAL_COMMUNITY): Payer: Self-pay | Admitting: Psychiatry

## 2018-07-26 ENCOUNTER — Ambulatory Visit (INDEPENDENT_AMBULATORY_CARE_PROVIDER_SITE_OTHER): Payer: Medicare Other | Admitting: Psychiatry

## 2018-07-26 VITALS — BP 128/78 | HR 51 | Ht 64.0 in | Wt 176.0 lb

## 2018-07-26 DIAGNOSIS — F411 Generalized anxiety disorder: Secondary | ICD-10-CM | POA: Diagnosis not present

## 2018-07-26 DIAGNOSIS — F332 Major depressive disorder, recurrent severe without psychotic features: Secondary | ICD-10-CM | POA: Diagnosis not present

## 2018-07-26 DIAGNOSIS — F5102 Adjustment insomnia: Secondary | ICD-10-CM | POA: Diagnosis not present

## 2018-07-26 MED ORDER — FLUOXETINE HCL 20 MG PO CAPS: ORAL_CAPSULE | ORAL | 0 refills | Status: DC

## 2018-07-26 MED ORDER — OXCARBAZEPINE 150 MG PO TABS: 150.0000 mg | ORAL_TABLET | Freq: Two times a day (BID) | ORAL | 2 refills | Status: DC
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
## 2018-08-30 ENCOUNTER — Ambulatory Visit (INDEPENDENT_AMBULATORY_CARE_PROVIDER_SITE_OTHER): Payer: Medicare Other | Admitting: Osteopathic Medicine

## 2018-08-30 ENCOUNTER — Encounter: Payer: Self-pay | Admitting: Osteopathic Medicine

## 2018-08-30 VITALS — BP 135/72 | HR 49 | Temp 98.8°F | Wt 174.5 lb

## 2018-08-30 DIAGNOSIS — G43001 Migraine without aura, not intractable, with status migrainosus: Secondary | ICD-10-CM

## 2018-08-30 MED ORDER — KETOROLAC TROMETHAMINE 60 MG/2ML IM SOLN
60.0000 mg | Freq: Once | INTRAMUSCULAR | Status: AC
  Administered 2018-08-30: 60 mg via INTRAMUSCULAR

## 2018-08-30 MED ORDER — DEXAMETHASONE SODIUM PHOSPHATE 4 MG/ML IJ SOLN
4.0000 mg | Freq: Once | INTRAMUSCULAR | Status: AC
  Administered 2018-08-30: 4 mg via INTRAMUSCULAR

## 2018-08-30 MED ORDER — PROMETHAZINE HCL 25 MG/ML IJ SOLN
25.0000 mg | Freq: Four times a day (QID) | INTRAMUSCULAR | Status: DC | PRN
  Administered 2018-08-30: 25 mg via INTRAMUSCULAR

## 2018-08-30 MED ORDER — RIZATRIPTAN BENZOATE 10 MG PO TABS: 10.0000 mg | ORAL_TABLET | ORAL | 0 refills | Status: DC | PRN

## 2018-08-30 NOTE — Patient Instructions (Signed)
   Refilled Maxalt  If migraines become more frequent, please let me know!  I'm not too worried about these blood pressure number, can keep an eye on these.   Will message cardiology to see if they'd like to follow-up with you
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
## 2018-09-01 ENCOUNTER — Inpatient Hospital Stay (HOSPITAL_COMMUNITY)
Admission: EM | Admit: 2018-09-01 | Discharge: 2018-09-11 | DRG: 066 | Disposition: A | Discharge status: Home / Self Care | Payer: Medicare Other | Source: Other Acute Inpatient Hospital | Attending: Neurosurgery | Admitting: Neurosurgery

## 2018-09-01 ENCOUNTER — Inpatient Hospital Stay (HOSPITAL_COMMUNITY): Payer: Medicare Other

## 2018-09-01 ENCOUNTER — Encounter (HOSPITAL_COMMUNITY): Payer: Self-pay | Admitting: Internal Medicine

## 2018-09-01 ENCOUNTER — Telehealth: Payer: Self-pay

## 2018-09-01 ENCOUNTER — Emergency Department: Payer: Self-pay

## 2018-09-01 DIAGNOSIS — I609 Nontraumatic subarachnoid hemorrhage, unspecified: Principal | ICD-10-CM

## 2018-09-01 DIAGNOSIS — G43909 Migraine, unspecified, not intractable, without status migrainosus: Secondary | ICD-10-CM | POA: Diagnosis not present

## 2018-09-01 DIAGNOSIS — F4310 Post-traumatic stress disorder, unspecified: Secondary | ICD-10-CM | POA: Diagnosis present

## 2018-09-01 DIAGNOSIS — Z79899 Other long term (current) drug therapy: Secondary | ICD-10-CM | POA: Diagnosis not present

## 2018-09-01 DIAGNOSIS — R51 Headache: Secondary | ICD-10-CM | POA: Diagnosis present

## 2018-09-01 DIAGNOSIS — Z818 Family history of other mental and behavioral disorders: Secondary | ICD-10-CM | POA: Diagnosis not present

## 2018-09-01 DIAGNOSIS — Z8709 Personal history of other diseases of the respiratory system: Secondary | ICD-10-CM | POA: Diagnosis not present

## 2018-09-01 DIAGNOSIS — Z90710 Acquired absence of both cervix and uterus: Secondary | ICD-10-CM | POA: Diagnosis not present

## 2018-09-01 DIAGNOSIS — G43001 Migraine without aura, not intractable, with status migrainosus: Secondary | ICD-10-CM

## 2018-09-01 DIAGNOSIS — R112 Nausea with vomiting, unspecified: Secondary | ICD-10-CM | POA: Diagnosis not present

## 2018-09-01 DIAGNOSIS — F17210 Nicotine dependence, cigarettes, uncomplicated: Secondary | ICD-10-CM | POA: Diagnosis present

## 2018-09-01 DIAGNOSIS — Z049 Encounter for examination and observation for unspecified reason: Secondary | ICD-10-CM | POA: Diagnosis not present

## 2018-09-01 DIAGNOSIS — I608 Other nontraumatic subarachnoid hemorrhage: Secondary | ICD-10-CM | POA: Diagnosis not present

## 2018-09-01 DIAGNOSIS — R030 Elevated blood-pressure reading, without diagnosis of hypertension: Secondary | ICD-10-CM | POA: Diagnosis not present

## 2018-09-01 DIAGNOSIS — R001 Bradycardia, unspecified: Secondary | ICD-10-CM | POA: Diagnosis not present

## 2018-09-01 DIAGNOSIS — Z888 Allergy status to other drugs, medicaments and biological substances status: Secondary | ICD-10-CM

## 2018-09-01 DIAGNOSIS — R58 Hemorrhage, not elsewhere classified: Secondary | ICD-10-CM

## 2018-09-01 DIAGNOSIS — I6200 Nontraumatic subdural hemorrhage, unspecified: Secondary | ICD-10-CM | POA: Diagnosis not present

## 2018-09-01 DIAGNOSIS — Z7951 Long term (current) use of inhaled steroids: Secondary | ICD-10-CM | POA: Diagnosis not present

## 2018-09-01 DIAGNOSIS — Z8719 Personal history of other diseases of the digestive system: Secondary | ICD-10-CM | POA: Diagnosis not present

## 2018-09-01 DIAGNOSIS — I6612 Occlusion and stenosis of left anterior cerebral artery: Secondary | ICD-10-CM | POA: Diagnosis not present

## 2018-09-01 DIAGNOSIS — F329 Major depressive disorder, single episode, unspecified: Secondary | ICD-10-CM | POA: Diagnosis present

## 2018-09-01 DIAGNOSIS — R52 Pain, unspecified: Secondary | ICD-10-CM

## 2018-09-01 DIAGNOSIS — Z8659 Personal history of other mental and behavioral disorders: Secondary | ICD-10-CM | POA: Diagnosis not present

## 2018-09-01 HISTORY — DX: Nontraumatic subarachnoid hemorrhage, unspecified: I60.9

## 2018-09-01 LAB — PROTIME-INR
INR: 0.96
Prothrombin Time: 12.7 seconds (ref 11.4–15.2)

## 2018-09-01 LAB — COMPREHENSIVE METABOLIC PANEL
ALT: 24 U/L (ref 0–44)
AST: 26 U/L (ref 15–41)
Albumin: 3.8 g/dL (ref 3.5–5.0)
Alkaline Phosphatase: 53 U/L (ref 38–126)
Anion gap: 11 (ref 5–15)
BUN: 12 mg/dL (ref 6–20)
CO2: 25 mmol/L (ref 22–32)
Calcium: 9.2 mg/dL (ref 8.9–10.3)
Chloride: 105 mmol/L (ref 98–111)
Creatinine, Ser: 0.69 mg/dL (ref 0.44–1.00)
GFR calc Af Amer: >60 mL/min (ref >60)
GFR calc non Af Amer: >60 mL/min (ref >60)
GLUCOSE: 104 mg/dL — AB (ref 70–99)
Potassium: 3.8 mmol/L (ref 3.5–5.1)
Sodium: 141 mmol/L (ref 135–145)
Total Bilirubin: 0.7 mg/dL (ref 0.3–1.2)
Total Protein: 6.3 g/dL — ABNORMAL LOW (ref 6.5–8.1)

## 2018-09-01 LAB — CBC WITH DIFFERENTIAL/PLATELET
Abs Immature Granulocytes: 0.02 10*3/uL (ref 0.00–0.07)
Basophils Absolute: 0 10*3/uL (ref 0.0–0.1)
Basophils Relative: 0 %
Eosinophils Absolute: 0.1 10*3/uL (ref 0.0–0.5)
Eosinophils Relative: 1 %
HCT: 40.2 % (ref 36.0–46.0)
Hemoglobin: 13 g/dL (ref 12.0–15.0)
Immature Granulocytes: 0 %
Lymphocytes Relative: 20 %
Lymphs Abs: 2 10*3/uL (ref 0.7–4.0)
MCH: 32.1 pg (ref 26.0–34.0)
MCHC: 32.3 g/dL (ref 30.0–36.0)
MCV: 99.3 fL (ref 80.0–100.0)
MONOS PCT: 6 %
Monocytes Absolute: 0.6 10*3/uL (ref 0.1–1.0)
Neutro Abs: 7.1 10*3/uL (ref 1.7–7.7)
Neutrophils Relative %: 73 %
Platelets: 161 10*3/uL (ref 150–400)
RBC: 4.05 MIL/uL (ref 3.87–5.11)
RDW: 12.9 % (ref 11.5–15.5)
WBC: 9.8 10*3/uL (ref 4.0–10.5)
nRBC: 0 % (ref 0.0–0.2)

## 2018-09-01 LAB — URINALYSIS, ROUTINE W REFLEX MICROSCOPIC
Bilirubin Urine: NEGATIVE
GLUCOSE, UA: NEGATIVE mg/dL
Hgb urine dipstick: NEGATIVE
Ketones, ur: NEGATIVE mg/dL
Leukocytes, UA: NEGATIVE
Nitrite: NEGATIVE
Protein, ur: NEGATIVE mg/dL
Specific Gravity, Urine: 1.008 (ref 1.005–1.030)
pH: 8 (ref 5.0–8.0)

## 2018-09-01 MED ORDER — NICARDIPINE HCL IN NACL 20-0.86 MG/200ML-% IV SOLN
0.0000 mg/h | INTRAVENOUS | Status: DC
  Administered 2018-09-01 – 2018-09-04 (×2): 5 mg/h via INTRAVENOUS
  Filled 2018-09-01 (×2): qty 200

## 2018-09-01 MED ORDER — OXCARBAZEPINE 150 MG PO TABS
150.0000 mg | ORAL_TABLET | Freq: Two times a day (BID) | ORAL | Status: DC
  Administered 2018-09-01: 150 mg via ORAL
  Filled 2018-09-01 (×2): qty 1

## 2018-09-01 MED ORDER — IOPAMIDOL (ISOVUE-370) INJECTION 76%
INTRAVENOUS | Status: AC
  Filled 2018-09-01: qty 100

## 2018-09-01 MED ORDER — PROMETHAZINE HCL 25 MG/ML IJ SOLN
25.0000 mg | Freq: Four times a day (QID) | INTRAMUSCULAR | Status: DC | PRN
  Administered 2018-09-03 – 2018-09-05 (×3): 25 mg via INTRAMUSCULAR
  Filled 2018-09-01 (×3): qty 1

## 2018-09-01 MED ORDER — FLUOXETINE HCL 20 MG PO CAPS
20.0000 mg | ORAL_CAPSULE | Freq: Every day | ORAL | Status: DC
  Administered 2018-09-02 – 2018-09-11 (×10): 20 mg via ORAL
  Filled 2018-09-01 (×10): qty 1

## 2018-09-01 MED ORDER — SENNOSIDES-DOCUSATE SODIUM 8.6-50 MG PO TABS
1.0000 | ORAL_TABLET | Freq: Two times a day (BID) | ORAL | Status: DC
  Administered 2018-09-02 – 2018-09-11 (×19): 1 via ORAL
  Filled 2018-09-01 (×19): qty 1

## 2018-09-01 MED ORDER — ACETAMINOPHEN 650 MG RE SUPP: 650.0000 mg | RECTAL | Status: DC | PRN

## 2018-09-01 MED ORDER — ACETAMINOPHEN 325 MG PO TABS
650.0000 mg | ORAL_TABLET | ORAL | Status: DC | PRN
  Administered 2018-09-03 – 2018-09-09 (×10): 650 mg via ORAL
  Filled 2018-09-01 (×10): qty 2

## 2018-09-01 MED ORDER — LABETALOL HCL 5 MG/ML IV SOLN
20.0000 mg | Freq: Once | INTRAVENOUS | Status: AC
  Administered 2018-09-01: 20 mg via INTRAVENOUS
  Filled 2018-09-01: qty 4

## 2018-09-01 MED ORDER — OXCARBAZEPINE 150 MG PO TABS: 150.0000 mg | ORAL_TABLET | Freq: Three times a day (TID) | ORAL | Status: DC

## 2018-09-01 MED ORDER — DEXAMETHASONE SODIUM PHOSPHATE 10 MG/ML IJ SOLN
10.0000 mg | Freq: Four times a day (QID) | INTRAMUSCULAR | Status: AC
  Administered 2018-09-01 – 2018-09-02 (×4): 10 mg via INTRAVENOUS
  Filled 2018-09-01 (×4): qty 1

## 2018-09-01 MED ORDER — PANTOPRAZOLE SODIUM 40 MG IV SOLR
40.0000 mg | Freq: Every day | INTRAVENOUS | Status: DC
  Administered 2018-09-01 – 2018-09-02 (×2): 40 mg via INTRAVENOUS
  Filled 2018-09-01 (×2): qty 40

## 2018-09-01 MED ORDER — ACETAMINOPHEN 160 MG/5ML PO SOLN: 650.0000 mg | ORAL | Status: DC | PRN

## 2018-09-01 MED ORDER — NIMODIPINE 60 MG/20ML PO SOLN: 60.0000 mg | ORAL | Status: DC

## 2018-09-01 MED ORDER — DEXAMETHASONE SODIUM PHOSPHATE 10 MG/ML IJ SOLN
10.0000 mg | Freq: Once | INTRAMUSCULAR | Status: AC
  Administered 2018-09-01: 10 mg via INTRAVENOUS
  Filled 2018-09-01: qty 1

## 2018-09-01 MED ORDER — BUDESONIDE 0.25 MG/2ML IN SUSP
0.2500 mg | Freq: Two times a day (BID) | RESPIRATORY_TRACT | Status: DC
  Administered 2018-09-01 – 2018-09-10 (×18): 0.25 mg via RESPIRATORY_TRACT
  Filled 2018-09-01 (×20): qty 2

## 2018-09-01 MED ORDER — IOPAMIDOL (ISOVUE-370) INJECTION 76%
75.0000 mL | Freq: Once | INTRAVENOUS | Status: AC | PRN
  Administered 2018-09-01: 75 mL via INTRAVENOUS

## 2018-09-01 MED ORDER — NIMODIPINE 30 MG PO CAPS
60.0000 mg | ORAL_CAPSULE | ORAL | Status: DC
  Administered 2018-09-01 – 2018-09-11 (×58): 60 mg via ORAL
  Filled 2018-09-01 (×58): qty 2

## 2018-09-01 MED ORDER — IPRATROPIUM-ALBUTEROL 0.5-2.5 (3) MG/3ML IN SOLN: 3.0000 mL | RESPIRATORY_TRACT | Status: DC | PRN

## 2018-09-01 MED ORDER — ACETAMINOPHEN-CODEINE #3 300-30 MG PO TABS
1.0000 | ORAL_TABLET | ORAL | Status: DC | PRN
  Administered 2018-09-02 – 2018-09-11 (×20): 2 via ORAL
  Administered 2018-09-11: 1 via ORAL
  Filled 2018-09-01 (×9): qty 2
  Filled 2018-09-01: qty 1
  Filled 2018-09-01 (×12): qty 2

## 2018-09-01 MED ORDER — FENTANYL CITRATE (PF) 100 MCG/2ML IJ SOLN
100.0000 ug | Freq: Once | INTRAMUSCULAR | Status: AC
  Administered 2018-09-01: 100 ug via INTRAVENOUS
  Filled 2018-09-01: qty 2

## 2018-09-01 MED ORDER — MORPHINE SULFATE (PF) 2 MG/ML IV SOLN
1.0000 mg | INTRAVENOUS | Status: DC | PRN
  Administered 2018-09-02 – 2018-09-03 (×5): 2 mg via INTRAVENOUS
  Administered 2018-09-03: 4 mg via INTRAVENOUS
  Administered 2018-09-03 – 2018-09-08 (×16): 2 mg via INTRAVENOUS
  Administered 2018-09-08: 4 mg via INTRAVENOUS
  Administered 2018-09-08 – 2018-09-10 (×3): 2 mg via INTRAVENOUS
  Filled 2018-09-01 (×3): qty 1
  Filled 2018-09-01: qty 2
  Filled 2018-09-01 (×8): qty 1
  Filled 2018-09-01: qty 2
  Filled 2018-09-01 (×13): qty 1

## 2018-09-01 MED ORDER — SODIUM CHLORIDE 0.9 % IV SOLN
INTRAVENOUS | Status: DC
  Administered 2018-09-01 – 2018-09-07 (×14): via INTRAVENOUS

## 2018-09-01 MED ORDER — ONDANSETRON HCL 4 MG/2ML IJ SOLN
4.0000 mg | Freq: Once | INTRAMUSCULAR | Status: AC
  Administered 2018-09-01: 4 mg via INTRAVENOUS
  Filled 2018-09-01: qty 2

## 2018-09-01 NOTE — Telephone Encounter (Signed)
Pt's emergency contact, Corene Cornea, called for pt to report that she is having high blood pressure readings and has a very bad headache. Corene Cornea reports pt does not feel well enough to call office and he was asked to call.   I called pt and she states that at 10:00 her blood pressure was 222/141 with a pulse of 45. She had "a pounding headache" and stated "I felt like I was going to stroke out". Pt states she took some Rizatriptan and Aspirin and laid down. Pt has some relief now but still not feeling well, reports last BP reading was 135/78 with a pulse of 49.   Advised pt she needs to be seen at Emergency Room, and she does not need to drive herself. She stated her emergency contact, Corene Cornea, could take her but he is over an hour away. I advised pt she does not need to wait, she stated she will call an ambulance and be seen.  FYI to PCP

## 2018-09-01 NOTE — ED Triage Notes (Signed)
Pt BIB [facility] from [facility] with subarachnoid hemorrhage. Went to primary care doctor 2 days ago for migraine. Given migraine cocktail in PCP office. Emesis episode at 0930 today and BP 461J systolic. PCP advised pt to go to ER where they found brain bleed on CT scan. VSS at this time.
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
## 2018-09-02 ENCOUNTER — Inpatient Hospital Stay (HOSPITAL_COMMUNITY): Payer: Medicare Other

## 2018-09-02 LAB — MRSA PCR SCREENING: MRSA BY PCR: NEGATIVE

## 2018-09-02 MED ORDER — OXCARBAZEPINE 300 MG PO TABS
300.0000 mg | ORAL_TABLET | Freq: Every day | ORAL | Status: DC
  Administered 2018-09-02 – 2018-09-10 (×9): 300 mg via ORAL
  Filled 2018-09-02 (×10): qty 1

## 2018-09-02 MED ORDER — GADOBUTROL 1 MMOL/ML IV SOLN
7.0000 mL | Freq: Once | INTRAVENOUS | Status: AC | PRN
  Administered 2018-09-02: 7 mL via INTRAVENOUS

## 2018-09-02 NOTE — Progress Notes (Signed)
Subjective: Patient reports minimal complaints at present  Objective: Vital signs in last 24 hours: Temp:  [98.6 F (37 C)] 98.6 F (37 C) (12/21 0800) Pulse Rate:  [41-67] 67 (12/21 0800) Resp:  [7-21] 15 (12/21 0800) BP: (91-155)/(56-94) 113/68 (12/21 0800) SpO2:  [90 %-99 %] 95 % (12/21 0800) Weight:  [78.9 kg] 78.9 kg (12/20 1857)  Intake/Output from previous day: No intake/output data recorded. Intake/Output this shift: Total I/O In: 1353.3 [I.V.:1353.3] Out: -   Physical Exam: Patient is awake, alert, conversant.  MAEW.  Headache not severe at present.  Lab Results: Recent Labs    09/01/18 1906  WBC 9.8  HGB 13.0  HCT 40.2  PLT 161   BMET Recent Labs    09/01/18 1906  NA 141  K 3.8  CL 105  CO2 25  GLUCOSE 104*  BUN 12  CREATININE 0.69  CALCIUM 9.2    Studies/Results: Ct Angio Head W/cm &/or Wo Cm  Result Date: 09/01/2018 CLINICAL DATA:  57 y/o F; possible history of subarachnoid hemorrhage. EXAM: CT ANGIOGRAPHY HEAD TECHNIQUE: Multidetector CT imaging of the head was performed using the standard protocol during bolus administration of intravenous contrast. Multiplanar CT image reconstructions and MIPs were obtained to evaluate the vascular anatomy. CONTRAST:  32mL ISOVUE-370 IOPAMIDOL (ISOVUE-370) INJECTION 76% COMPARISON:  None. FINDINGS: CT HEAD Brain: Small volume of subarachnoid hemorrhage over the bilateral parietal convexities, right frontal lobe, and within the left sylvian fissure. No brain parenchymal hemorrhage or intraventricular hemorrhage. No hydrocephalus. No stroke, focal mass effect, or herniation. Vascular: No hyperdense vessel or unexpected calcification. Skull: Normal. Negative for fracture or focal lesion. Sinuses: Left maxillary sinus mucous retention cyst. Additional visible paranasal sinuses and the mastoid air cells are normally aerated. Orbits: No acute finding. CTA HEAD Anterior circulation: No large vessel occlusion, aneurysm, or  vascular malformation identified. There are a few segments of mild-to-moderate stenosis predominantly in the ACA and left MCA distributions (series 12, image 27). Posterior circulation: No large vessel occlusion, aneurysm, or vascular malformation identified. Segments of mild-to-moderate stenosis in the left PCA distribution (series 12, image 22). Venous sinuses: As permitted by contrast timing, patent. Anatomic variants: None significant. Delayed phase: No abnormal intracranial enhancement. IMPRESSION: CT head: Small volume of subarachnoid hemorrhage over the cerebral convexities and within the left sylvian fissure. No hydrocephalus. CTA head: 1. No large vessel occlusion, aneurysm, or vascular malformation identified. 2. Few segments of mild-to-moderate stenosis in the ACA, left MCA, and left PCA distributions. Findings may represent vasospasm in the setting of subarachnoid hemorrhage or vasculitis. Critical Value/emergent results were called by telephone at the time of interpretation on 09/01/2018 at 10:47 pm to Dr. Daleen Bo , who verbally acknowledged these results. Electronically Signed   By: Kristine Garbe M.D.   On: 09/01/2018 22:50    Assessment/Plan: MRI results pending.  Depending on results, patient may require formal angiography.    LOS: 1 day    Peggyann Shoals, MD 09/02/2018, 10:29 AM

## 2018-09-02 NOTE — Progress Notes (Signed)
I came to see patient.  She is currently in MRI.

## 2018-09-02 NOTE — Progress Notes (Addendum)
NP made aware of SBP < 100, RN informed if SBP remains <100 then a 500 CC bolus will be ordered. No new orders at this time. WCTM

## 2018-09-03 MED ORDER — PHENOBARBITAL 16.2 MG PO TABS: 40.0000 mg | ORAL_TABLET | Freq: Three times a day (TID) | ORAL | Status: DC | PRN

## 2018-09-03 MED ORDER — VITAMIN D 25 MCG (1000 UNIT) PO TABS
5000.0000 [IU] | ORAL_TABLET | Freq: Every day | ORAL | Status: DC
  Administered 2018-09-04 – 2018-09-11 (×8): 5000 [IU] via ORAL
  Filled 2018-09-03 (×8): qty 5

## 2018-09-03 MED ORDER — PANTOPRAZOLE SODIUM 40 MG PO TBEC
40.0000 mg | DELAYED_RELEASE_TABLET | Freq: Every day | ORAL | Status: DC
  Administered 2018-09-03 – 2018-09-11 (×9): 40 mg via ORAL
  Filled 2018-09-03 (×9): qty 1

## 2018-09-03 NOTE — Progress Notes (Signed)
Paged on call Neurosurgeon about patients bradycardia when asleep. (As low as HR 29).  Was given orders to avoid opioid pain medication for headache.  Given orders for phenobarbital if HA persists.

## 2018-09-03 NOTE — Progress Notes (Signed)
Patient ID: Jessica Lang, female   DOB: September 16, 1960, 57 y.o.   MRN: [id number] Vital signs are stable Neurologically patient is doing quite well She reports minimal to no headache at this time She is on in the [place] Subarachnoid hemorrhage does not appear to have an evident cause MRI results were briefly discussed with the patient she has reversible vasoconstrictive phenomenon that may be related to medications Whether she needs another follow-up study and down the road I would defer to Dr. Saintclair Halsted.  She will remain in the hospital for now as she is on [place]

## 2018-09-04 ENCOUNTER — Inpatient Hospital Stay (HOSPITAL_COMMUNITY): Payer: Medicare Other

## 2018-09-04 LAB — HIV ANTIBODY (ROUTINE TESTING W REFLEX): HIV Screen 4th Generation wRfx: NONREACTIVE

## 2018-09-04 MED ORDER — PHENOBARBITAL 32.4 MG PO TABS
32.4000 mg | ORAL_TABLET | Freq: Three times a day (TID) | ORAL | Status: DC | PRN
  Administered 2018-09-04 (×3): 48.6 mg via ORAL
  Administered 2018-09-05 – 2018-09-06 (×3): 32.4 mg via ORAL
  Administered 2018-09-06 – 2018-09-07 (×2): 48.6 mg via ORAL
  Administered 2018-09-07: 32.4 mg via ORAL
  Administered 2018-09-08 – 2018-09-11 (×3): 48.6 mg via ORAL
  Filled 2018-09-04 (×2): qty 2
  Filled 2018-09-04: qty 1
  Filled 2018-09-04 (×3): qty 2
  Filled 2018-09-04: qty 1
  Filled 2018-09-04: qty 2
  Filled 2018-09-04: qty 1
  Filled 2018-09-04 (×3): qty 2

## 2018-09-04 NOTE — Progress Notes (Signed)
Subjective: Patient reports doing wellmoderateheadache  Objective: Vital signs in last 24 hours: Temp:  [97.9 F (36.6 C)-99.1 F (37.3 C)] 99.1 F (37.3 C) (12/23 1900) Pulse Rate:  [41-64] 46 (12/23 1900) Resp:  [9-28] 24 (12/23 1900) BP: (118-163)/(65-98) 140/72 (12/23 1900) SpO2:  [87 %-98 %] 97 % (12/23 1935)  Intake/Output from previous day: 12/22 0701 - 12/23 0700 In: 3372.9 [P.O.:470; I.V.:2902.9] Out: -  Intake/Output this shift: No intake/output data recorded.  awake alert and oriented neurologically improved  Lab Results: No results for input(s): WBC, HGB, HCT, PLT in the last 72 hours. BMET No results for input(s): NA, K, CL, CO2, GLUCOSE, BUN, CREATININE, CALCIUM in the last 72 hours.  Studies/Results: Ct Head Wo Contrast  Result Date: 09/04/2018 CLINICAL DATA:  Follow-up subarachnoid hemorrhage EXAM: CT HEAD WITHOUT CONTRAST TECHNIQUE: Contiguous axial images were obtained from the base of the skull through the vertex without intravenous contrast. COMPARISON:  09/01/2018 FINDINGS: Brain: There are changes consistent with prior subarachnoid hemorrhage posteriorly near the vertex as well as within the left sylvian fissure and in the right frontal lobe. The overall appearance is stable from the prior exam. No new hemorrhage is seen. No other focal abnormality is noted. Vascular: Choose Skull: Normal. Negative for fracture or focal lesion. Sinuses/Orbits: No acute finding. Other: None. IMPRESSION: Stable subarachnoid hemorrhage bilaterally Electronically Signed   By: Inez Catalina M.D.   On: 09/04/2018 02:31    Assessment/Plan: Continue to observe in the ICU hospital day for subarachnoid hemorrhage  LOS: 3 days     Orvell Careaga P 09/04/2018, 7:56 PM

## 2018-09-04 NOTE — Progress Notes (Addendum)
Spoke with Dr. Saintclair Halsted. New blood pressure goals are SBP 100-150 and DBP less than 90.  Due to patient having a low heart rate, Saintclair Halsted does not want to add labetalol.  RN to call attending if blood pressure is sustained above the parameters.

## 2018-09-04 NOTE — Progress Notes (Signed)
Patient ID: Jessica Lang, female   DOB: 1960/10/24, 57 y.o.   MRN: [id number] Called about patients recurrent symptoms of head ache this am. Repeat CT shows no increase in sub arachnoid blood, no other changes. Patients symptoms spontaneously resolved.

## 2018-09-04 NOTE — Progress Notes (Signed)
Chaplain responded to spiritual consult for information on Advanced Directive. Patient was asleep in room, shades drawn, wearing eye mask, indicating to chaplain that she probably did not want to be woken. Will be available if patient is ready. Tamsen Snider Pager [phone]

## 2018-09-04 NOTE — Progress Notes (Signed)
Patient woke up at 0200 with "stabbing" uncontrolled pain in head, 10/10 pain. Pt. Reported that the pain "was exactly the same as when she was originally admitted." Paged Dr. Ellene Route and received orders for stat head CT.  Rapid response nurse and RN took the patient for CT scan and returned without complications.  Pt reports that pain has subsided.

## 2018-09-05 NOTE — Progress Notes (Signed)
Overall stable.  Moderate headache.  No other neurologic symptoms.  She is afebrile.  She is mildly bradycardic.  Blood pressure good.  Urine output good.  Awakens easily.  Speech fluent.  Judgment insight intact.  Cranial nerve function normal bilateral.  Motor examination 5/5 bilaterally.  No pronator drift.  Status post nonaneurysmal subarachnoid hemorrhage.  Continue ICU observation and nimodipine for now.  Patient will likely require follow-up angiogram at a later time.

## 2018-09-06 MED ORDER — MAGNESIUM HYDROXIDE 400 MG/5ML PO SUSP
15.0000 mL | Freq: Every day | ORAL | Status: DC | PRN
  Administered 2018-09-09: 15 mL via ORAL
  Filled 2018-09-06: qty 30

## 2018-09-06 NOTE — Progress Notes (Signed)
Patient ID: Jessica Lang, female   DOB: April 21, 1961, 57 y.o.   MRN: [id number] Vital signs are stable Patient feels well Hospitalized for non-aneurysmal subarachnoid hemorrhage Still with significant headache Patient's family will be visiting today I will allow her to have a shower Milk of magnesia for constipation Continues conservative care

## 2018-09-07 MED ORDER — DEXAMETHASONE SODIUM PHOSPHATE 10 MG/ML IJ SOLN
4.0000 mg | Freq: Four times a day (QID) | INTRAMUSCULAR | Status: DC
  Administered 2018-09-07 – 2018-09-11 (×17): 4 mg via INTRAVENOUS
  Filled 2018-09-07 (×17): qty 1

## 2018-09-07 NOTE — Progress Notes (Signed)
Subjective: Patient reports overall patient about the same still condition of severe headache no nausea no vomiting  Objective: Vital signs in last 24 hours: Temp:  [98.1 F (36.7 C)-98.7 F (37.1 C)] 98.4 F (36.9 C) (12/26 0800) Pulse Rate:  [50-71] 63 (12/26 0800) Resp:  [8-29] 18 (12/26 0800) BP: (95-139)/(59-87) 139/84 (12/26 0800) SpO2:  [92 %-99 %] 97 % (12/26 0821)  Intake/Output from previous day: 12/25 0701 - 12/26 0700 In: 2169.6 [P.O.:720; I.V.:1449.6] Out: -  Intake/Output this shift: Total I/O In: 1524.7 [I.V.:1524.7] Out: -   awake alert and oriented pupils equal strength 5 out of 5  Lab Results: No results for input(s): WBC, HGB, HCT, PLT in the last 72 hours. BMET No results for input(s): NA, K, CL, CO2, GLUCOSE, BUN, CREATININE, CALCIUM in the last 72 hours.  Studies/Results: No results found.  Assessment/Plan: 57 year old female with spontaneous subarachnoid hemorrhage workup essentially negative so far will discuss with Dr. Kathyrn Sheriff timing of angiography versus follow-up MRI scan.  LOS: 6 days     Jessica Lang P 09/07/2018, 10:30 AM

## 2018-09-08 NOTE — Progress Notes (Signed)
NEUROSURGERY PROGRESS NOTE  Doing well. Complains of headaches at times. No acute events overnight  Temp:  [97.8 F (36.6 C)-98.6 F (37 C)] 98.6 F (37 C) (12/27 0400) Pulse Rate:  [44-82] 60 (12/27 0606) Resp:  [7-25] 12 (12/27 0606) BP: (101-161)/(57-86) 113/57 (12/27 0600) SpO2:  [92 %-100 %] 95 % (12/27 0606)  Plan: Dr. Kathyrn Sheriff will plan to do an angiogram at some point.   Eleonore Chiquito, NP 09/08/2018 7:34 AM

## 2018-09-09 NOTE — Progress Notes (Signed)
Nutrition Brief Note  Patient identified on the Malnutrition Screening Tool (MST) Report. MST had been retaken d/t prolonged LOS.   Per review of chart, pt has eaten 50-100% of all meals that have been documented on.   On arrival, pt also confirms she has been eating well. She has no complaints whatsoever. She has been receiving meals from outside the hospital via friends/family.   Reviewing her weight history, pt has maintained her weight during the hospitalization. She was  174.5 lb at admission and is 173.3 today. Long term, she has lost 20 or so lbs from last year. This would have been a favorable loss given her obese status. Did not ask patient directly about her weight as she had friends visiting.   RD offered to add items to her tray. She asked for fresh fruits. Will add. No further interventions.   Burtis Junes RD, LDN, CNSC Clinical Nutrition Available Tues-Sat via Pager: [phone] 09/09/2018 3:47 PM

## 2018-09-09 NOTE — Progress Notes (Signed)
  NEUROSURGERY PROGRESS NOTE   No issues overnight. No complaints this am.  EXAM:  BP (!) 95/50   Pulse (!) 54   Temp 98.2 F (36.8 C) (Oral)   Resp 16   Ht 5\' 4"  (1.626 m)   Wt 78.6 kg   SpO2 96%   BMI 29.74 kg/m   Awake, alert, oriented  Speech fluent, appropriate  CN grossly intact  5/5 BUE/BLE   IMPRESSION:  57 y.o. female SAH d# 8, initial CTA negative. Remains neurologically intact  PLAN: - Cont observation - Will plan on f/u angiogram early this week

## 2018-09-09 NOTE — Plan of Care (Signed)
  Problem: Activity: Goal: Risk for activity intolerance will decrease Outcome: Progressing   

## 2018-09-10 NOTE — Progress Notes (Signed)
NEUROSURGERY PROGRESS NOTE  Doing well. Complains of headaches at time. No acute events overnight  Temp:  [97.7 F (36.5 C)-98.4 F (36.9 C)] 98.1 F (36.7 C) (12/29 0749) Pulse Rate:  [43-67] 50 (12/29 0600) Resp:  [8-23] 14 (12/29 0600) BP: (95-129)/(50-83) 129/78 (12/29 0500) SpO2:  [91 %-97 %] 97 % (12/29 0865)  Plan: Dr. Kathyrn Sheriff planning to angio this week. No new nsgy recom  Eleonore Chiquito, NP 09/10/2018 8:36 AM

## 2018-09-11 ENCOUNTER — Other Ambulatory Visit: Payer: Self-pay

## 2018-09-11 ENCOUNTER — Inpatient Hospital Stay (HOSPITAL_COMMUNITY): Payer: Medicare Other

## 2018-09-11 HISTORY — PX: IR ANGIO INTRA EXTRACRAN SEL COM CAROTID INNOMINATE UNI R MOD SED: IMG5359

## 2018-09-11 HISTORY — PX: IR ANGIO VERTEBRAL SEL VERTEBRAL UNI R MOD SED: IMG5368

## 2018-09-11 HISTORY — PX: IR US GUIDE VASC ACCESS RIGHT: IMG2390

## 2018-09-11 HISTORY — PX: IR ANGIO INTRA EXTRACRAN SEL INTERNAL CAROTID UNI L MOD SED: IMG5361

## 2018-09-11 MED ORDER — LIDOCAINE HCL 1 % IJ SOLN
INTRAMUSCULAR | Status: AC | PRN
  Administered 2018-09-11: 1 mL

## 2018-09-11 MED ORDER — IOPAMIDOL (ISOVUE-300) INJECTION 61%
INTRAVENOUS | Status: AC
  Administered 2018-09-11: 35 mL
  Filled 2018-09-11: qty 100

## 2018-09-11 MED ORDER — HEPARIN SODIUM (PORCINE) 1000 UNIT/ML IJ SOLN
INTRAMUSCULAR | Status: AC
  Filled 2018-09-11: qty 1

## 2018-09-11 MED ORDER — FENTANYL CITRATE (PF) 100 MCG/2ML IJ SOLN
INTRAMUSCULAR | Status: AC | PRN
  Administered 2018-09-11: 50 ug via INTRAVENOUS

## 2018-09-11 MED ORDER — NITROGLYCERIN 1 MG/10 ML FOR IR/CATH LAB
INTRA_ARTERIAL | Status: AC
  Administered 2018-09-11: 5 mL
  Filled 2018-09-11: qty 10

## 2018-09-11 MED ORDER — VERAPAMIL HCL 2.5 MG/ML IV SOLN
INTRAVENOUS | Status: AC
  Administered 2018-09-11: 2.5 mg
  Filled 2018-09-11: qty 2

## 2018-09-11 MED ORDER — LIDOCAINE HCL 1 % IJ SOLN
INTRAMUSCULAR | Status: AC
  Filled 2018-09-11: qty 20

## 2018-09-11 MED ORDER — FENTANYL CITRATE (PF) 100 MCG/2ML IJ SOLN
INTRAMUSCULAR | Status: AC
  Filled 2018-09-11: qty 2

## 2018-09-11 MED FILL — niMODipine 30 MG CAPS: 30 | 11 days supply | Qty: 132 | Fill #0

## 2018-09-11 MED FILL — ACETAMINOPHEN/COD #3 TABLET: 300-30 | 3 days supply | Qty: 30 | Fill #0

## 2018-09-11 NOTE — Plan of Care (Signed)
  Problem: Education: Goal: Knowledge of disease or condition will improve Outcome: Progressing Goal: Knowledge of secondary prevention will improve Outcome: Progressing Goal: Knowledge of patient specific risk factors addressed and post discharge goals established will improve Outcome: Progressing   Problem: Health Behavior/Discharge Planning: Goal: Ability to manage health-related needs will improve Outcome: Progressing   Problem: Self-Care: Goal: Ability to participate in self-care as condition permits will improve Outcome: Progressing Goal: Verbalization of feelings and concerns over difficulty with self-care will improve Outcome: Progressing Goal: Ability to communicate needs accurately will improve Outcome: Progressing   Problem: Spontaneous Subarachnoid Hemorrhage Tissue Perfusion: Goal: Complications of Spontaneous Subarachnoid Hemorrhage will be minimized Outcome: Progressing

## 2018-09-11 NOTE — Progress Notes (Signed)
IV removed, intact. Discharge instructions reviewed. Education complete. Care plan goals adequate for discharge. Patient discharged with belongings.

## 2018-09-11 NOTE — Discharge Summary (Addendum)
Physician Discharge Summary  Patient ID: Jessica Lang MRN: [id number] DOB/AGE: 12-28-1960 57 y.o.  Admit date: 09/01/2018 Discharge date: 09/11/2018  Admission Diagnoses: subarachnoid hemorrhage    Discharge Diagnoses:same   Discharged Condition: good  Hospital Course: The patient was admitted on 09/01/2018 and taken to the ICU after a sudden onset of the worst headache of her life. The hospital course was routine. There were no complications.  Pt had appropriate headaches. The patient remained afebrile with stable vital signs, and tolerated a regular diet. The patient continued to increase activities, and pain was well controlled with oral pain medications. CTA and angiogram was negative for aneurysm. Pt not a rehab candidate, do not think it is medically necessary  Consults: None  Significant Diagnostic Studies:  Results for orders placed or performed during the hospital encounter of 09/01/18  MRSA PCR Screening  Result Value Ref Range   MRSA by PCR NEGATIVE NEGATIVE  Comprehensive metabolic panel  Result Value Ref Range   Sodium 141 135 - 145 mmol/L   Potassium 3.8 3.5 - 5.1 mmol/L   Chloride 105 98 - 111 mmol/L   CO2 25 22 - 32 mmol/L   Glucose, Bld 104 (H) 70 - 99 mg/dL   BUN 12 6 - 20 mg/dL   Creatinine, Ser 0.69 0.44 - 1.00 mg/dL   Calcium 9.2 8.9 - 10.3 mg/dL   Total Protein 6.3 (L) 6.5 - 8.1 g/dL   Albumin 3.8 3.5 - 5.0 g/dL   AST 26 15 - 41 U/L   ALT 24 0 - 44 U/L   Alkaline Phosphatase 53 38 - 126 U/L   Total Bilirubin 0.7 0.3 - 1.2 mg/dL   GFR calc non Af Amer >60 >60 mL/min   GFR calc Af Amer >60 >60 mL/min   Anion gap 11 5 - 15  CBC with Differential  Result Value Ref Range   WBC 9.8 4.0 - 10.5 K/uL   RBC 4.05 3.87 - 5.11 MIL/uL   Hemoglobin 13.0 12.0 - 15.0 g/dL   HCT 40.2 36.0 - 46.0 %   MCV 99.3 80.0 - 100.0 fL   MCH 32.1 26.0 - 34.0 pg   MCHC 32.3 30.0 - 36.0 g/dL   RDW 12.9 11.5 - 15.5 %   Platelets 161 150 - 400 K/uL   nRBC 0.0 0.0 - 0.2 %    Neutrophils Relative % 73 %   Neutro Abs 7.1 1.7 - 7.7 K/uL   Lymphocytes Relative 20 %   Lymphs Abs 2.0 0.7 - 4.0 K/uL   Monocytes Relative 6 %   Monocytes Absolute 0.6 0.1 - 1.0 K/uL   Eosinophils Relative 1 %   Eosinophils Absolute 0.1 0.0 - 0.5 K/uL   Basophils Relative 0 %   Basophils Absolute 0.0 0.0 - 0.1 K/uL   Immature Granulocytes 0 %   Abs Immature Granulocytes 0.02 0.00 - 0.07 K/uL  Protime-INR  Result Value Ref Range   Prothrombin Time 12.7 11.4 - 15.2 seconds   INR 0.96   Urinalysis, Routine w reflex microscopic  Result Value Ref Range   Color, Urine STRAW (A) YELLOW   APPearance CLEAR CLEAR   Specific Gravity, Urine 1.008 1.005 - 1.030   pH 8.0 5.0 - 8.0   Glucose, UA NEGATIVE NEGATIVE mg/dL   Hgb urine dipstick NEGATIVE NEGATIVE   Bilirubin Urine NEGATIVE NEGATIVE   Ketones, ur NEGATIVE NEGATIVE mg/dL   Protein, ur NEGATIVE NEGATIVE mg/dL   Nitrite NEGATIVE NEGATIVE   Leukocytes, UA NEGATIVE NEGATIVE  HIV antibody (Routine Testing)  Result Value Ref Range   HIV Screen 4th Generation wRfx Non Reactive Non Reactive    Ct Angio Head W/cm &/or Wo Cm  Result Date: 09/01/2018 CLINICAL DATA:  57 y/o F; possible history of subarachnoid hemorrhage. EXAM: CT ANGIOGRAPHY HEAD TECHNIQUE: Multidetector CT imaging of the head was performed using the standard protocol during bolus administration of intravenous contrast. Multiplanar CT image reconstructions and MIPs were obtained to evaluate the vascular anatomy. CONTRAST:  20m ISOVUE-370 IOPAMIDOL (ISOVUE-370) INJECTION 76% COMPARISON:  None. FINDINGS: CT HEAD Brain: Small volume of subarachnoid hemorrhage over the bilateral parietal convexities, right frontal lobe, and within the left sylvian fissure. No brain parenchymal hemorrhage or intraventricular hemorrhage. No hydrocephalus. No stroke, focal mass effect, or herniation. Vascular: No hyperdense vessel or unexpected calcification. Skull: Normal. Negative for fracture or  focal lesion. Sinuses: Left maxillary sinus mucous retention cyst. Additional visible paranasal sinuses and the mastoid air cells are normally aerated. Orbits: No acute finding. CTA HEAD Anterior circulation: No large vessel occlusion, aneurysm, or vascular malformation identified. There are a few segments of mild-to-moderate stenosis predominantly in the ACA and left MCA distributions (series 12, image 27). Posterior circulation: No large vessel occlusion, aneurysm, or vascular malformation identified. Segments of mild-to-moderate stenosis in the left PCA distribution (series 12, image 22). Venous sinuses: As permitted by contrast timing, patent. Anatomic variants: None significant. Delayed phase: No abnormal intracranial enhancement. IMPRESSION: CT head: Small volume of subarachnoid hemorrhage over the cerebral convexities and within the left sylvian fissure. No hydrocephalus. CTA head: 1. No large vessel occlusion, aneurysm, or vascular malformation identified. 2. Few segments of mild-to-moderate stenosis in the ACA, left MCA, and left PCA distributions. Findings may represent vasospasm in the setting of subarachnoid hemorrhage or vasculitis. Critical Value/emergent results were called by telephone at the time of interpretation on 09/01/2018 at 10:47 pm to Dr. EDaleen Bo, who verbally acknowledged these results. Electronically Signed   By: LKristine GarbeM.D.   On: 09/01/2018 22:50   Ct Head Wo Contrast  Result Date: 09/04/2018 CLINICAL DATA:  Follow-up subarachnoid hemorrhage EXAM: CT HEAD WITHOUT CONTRAST TECHNIQUE: Contiguous axial images were obtained from the base of the skull through the vertex without intravenous contrast. COMPARISON:  09/01/2018 FINDINGS: Brain: There are changes consistent with prior subarachnoid hemorrhage posteriorly near the vertex as well as within the left sylvian fissure and in the right frontal lobe. The overall appearance is stable from the prior exam. No new  hemorrhage is seen. No other focal abnormality is noted. Vascular: Choose Skull: Normal. Negative for fracture or focal lesion. Sinuses/Orbits: No acute finding. Other: None. IMPRESSION: Stable subarachnoid hemorrhage bilaterally Electronically Signed   By: MInez CatalinaM.D.   On: 09/04/2018 02:31   Mr BJeri CosWKNContrast  Result Date: 09/02/2018 CLINICAL DATA:  Worst headache of life.  Subarachnoid hemorrhage. EXAM: MRI HEAD WITHOUT AND WITH CONTRAST TECHNIQUE: Multiplanar, multiecho pulse sequences of the brain and surrounding structures were obtained without and with intravenous contrast. CONTRAST:  7 mL Gadovist IV COMPARISON:  CT a head 09/01/2018 FINDINGS: Brain: Ventricle size normal. Negative for acute infarct. Few small subcortical white matter hyperintensities. Negative for mass Subarachnoid hemorrhage over the convexity bilaterally appears stable from CT yesterday. No blood in the basilar cisterns. Vascular: Normal arterial flow voids none Normal enhancement postcontrast imaging. No enhancing mass. Leptomeningeal enhancement is normal. Skull and upper cervical spine: Negative Sinuses/Orbits: Mild mucosal edema paranasal sinuses.  Normal orbit Other: None IMPRESSION: Bilateral  convexity subarachnoid hemorrhage unchanged from yesterday. No aneurysm or vascular malformation identified on CTA. Vascular narrowing noted on CTA suggestive of vasospasm or vasculitis. 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 Vasculitis not considered likely given the acute hemorrhage. These results were called by telephone at the time of interpretation on 09/02/2018 at 10:56 am to Dr. Vertell Limber , who verbally acknowledged these results. Electronically Signed   By: Franchot Gallo M.D.   On: 09/02/2018 10:59    Antibiotics:  Anti-infectives (From admission, onward)   None      Discharge Exam: Blood pressure 128/80, pulse (!) 52, temperature  98.3 F (36.8 C), temperature source Oral, resp. rate 10, height '5\' 4"'  (1.626 m), weight 78.6 kg, SpO2 95 %. Neurologic: Grossly normal Ambulating and voiding well  Discharge Medications:   Allergies as of 09/11/2018      Reactions   Lithium Other (See Comments)   More aggressive      Medication List    STOP taking these medications   aspirin EC 325 MG tablet   budesonide 0.25 MG/2ML nebulizer solution Commonly known as:  PULMICORT     TAKE these medications   cetirizine 10 MG chewable tablet Commonly known as:  ZYRTEC Chew 10 mg by mouth daily.   FLUoxetine 20 MG capsule Commonly known as:  PROZAC TAKE 1 CAPSULE BY MOUTH EVERY DAY   ipratropium-albuterol 0.5-2.5 (3) MG/3ML Soln Commonly known as:  DUONEB Take 3 mLs by nebulization every 4 (four) hours as needed (SOB/wheezing). 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   Nebulizer Devi Use with nebulized medicines as directed   Nebulizer/Tubing/Mouthpiece Kit Use with nebulized medicines as directed   OXcarbazepine 150 MG tablet Commonly known as:  TRILEPTAL Take 1 tablet (150 mg total) by mouth 2 (two) times daily. What changed:    how much to take  when to take this  Another medication with the same name was removed. Continue taking this medication, and follow the directions you see here.   rizatriptan 10 MG tablet Commonly known as:  MAXALT Take 1 tablet (10 mg total) by mouth as needed for migraine. May repeat in 2 hours if needed   VITAMIN D (CHOLECALCIFEROL) PO Take 5,000 Units by mouth daily.       Disposition: home   Final Dx: subarachnoid hemorrhage  Discharge Instructions    Call MD for:  difficulty breathing, headache or visual disturbances   Complete by:  As directed    Call MD for:  extreme fatigue   Complete by:  As directed    Call MD for:  hives   Complete by:  As directed    Call MD for:  persistant dizziness or light-headedness   Complete  by:  As directed    Call MD for:  persistant nausea and vomiting   Complete by:  As directed    Call MD for:  redness, tenderness, or signs of infection (pain, swelling, redness, odor or green/yellow discharge around incision site)   Complete by:  As directed    Call MD for:  severe uncontrolled pain   Complete by:  As directed    Call MD for:  temperature >100.4   Complete by:  As directed    Diet - low sodium heart healthy   Complete by:  As directed    Increase activity slowly   Complete by:  As directed  Follow-up Information    Kary Kos, MD. Schedule an appointment as soon as possible for a visit in 2 week(s).   Specialty:  Neurosurgery Contact information: [address] [place] [address] [phone]            Signed: Ocie Cornfield [facility] 09/11/2018, 4:55 PM  .

## 2018-09-11 NOTE — Sedation Documentation (Signed)
No versed given for case

## 2018-09-11 NOTE — Care Management Note (Signed)
Case Management Note  Patient Details  Name: Dell Hurtubise MRN: [id number] Date of Birth: [birth year]  Subjective/Objective:  Pt admitted on 09/01/18 with SAH.  PTA, pt independent, lives at home alone.                  Action/Plan: Pt for possible dc later after angiogram on Nimotop.  Recommend sending discharge Rx to [facility], as Nimotop generally not in stock at retail pharmacy.  Pt plans to dc home with a friend for a week or so to recover.  Meds will be brought to pt's bedside once filled by [facility] pharmacy.    Expected Discharge Date:                  Expected Discharge Plan:  Home/Self Care  In-House Referral:     Discharge planning Services  CM Consult, Medication Assistance  Post Acute Care Choice:    Choice offered to:     DME Arranged:    DME Agency:     HH Arranged:    HH Agency:     Status of Service:  Completed, signed off  If discussed at [employer] of Stay Meetings, dates discussed:    Additional Comments:  Reinaldo Raddle, RN, BSN  Trauma/Neuro ICU Case Manager [phone]

## 2018-09-11 NOTE — Progress Notes (Signed)
TR band fully deflated at 1500, removed at 1530. No issues with bleeding.  Band aid applied to access site. Patient educated on notifying RN for any changes in site.

## 2018-09-11 NOTE — Brief Op Note (Signed)
  NEUROSURGERY BRIEF OPERATIVE NOTE   PREOP DX: Subarachnoid hemorrhage  POSTOP DX: Same  PROCEDURE: Diagnostic cerebral angiogram  SURGEON: Dr. Consuella Lose, MD  ANESTHESIA: Local  EBL: Minimal  SPECIMENS: None  COMPLICATIONS: None  CONDITION: Stable to ICU  FINDINGS (Full report to follow in [place]): 1. No intracranial aneurysms, AVM, or fistula 2. Mild/moderate stenosis without flow limitation of left M2 branches, right A1 and M1/2 branches and distal PCA branches with beaded-string type appearance c/w RCVS

## 2018-09-12 ENCOUNTER — Encounter (HOSPITAL_COMMUNITY): Payer: Self-pay | Admitting: Neurosurgery

## 2018-09-26 ENCOUNTER — Other Ambulatory Visit: Payer: Self-pay

## 2018-09-26 DIAGNOSIS — I609 Nontraumatic subarachnoid hemorrhage, unspecified: Secondary | ICD-10-CM | POA: Diagnosis not present

## 2018-09-26 NOTE — Patient Outreach (Addendum)
[facility]) Care Management  09/26/2018  Jessica Lang 07-Jul-1961 [id number]    EMMI-STROKE RED ON EMMI ALERT Day # 13 Date: 09/25/2018 Red Alert Reason: " Went to follow up appt? No"   Outreach attempt # 1 to patient.  No answer. RN CM left HIPAA compliant voicemail message along with contact info.      Plan: RN CM will make outreach attempt to patient within 3-4 business days. RN CM will send unsuccessful outreach letter to patient.  Enzo Montgomery, RN,BSN,CCM [place] Management Telephonic Care Management Coordinator Direct Phone: [phone] Toll Free: [phone] Fax: [phone]

## 2018-09-26 NOTE — Patient Outreach (Signed)
[facility]) Care Management  09/26/2018  Jessica Lang 13-Sep-1961 [id number]   EMMI-STROKE RED ON EMMI ALERT Day # 13 Date: 09/25/2018 Red Alert Reason: " Went to follow up appt? No"    Incoming call from patient returning RN CM call. Patient pleased to report that things are going well for her and she was "one of the lucky ones" regarding not having effects from stroke. Reviewed and addressed red alert with patient. She voices that she completed follow up with neuro MD this morning. She states she got a good report and has been cleared to drive again. She voices that she was staying with some friends while she is recovering but is now able to return home. RN CM confirmed with patient that she has all her meds and no issues or concerns regarding them. She denies any further RN CM needs or concerns at this time. Patient has completed post discharge EMMI stroke calls. She was very appreciative of follow up call and care she received while in the hospital.    Plan: RN CM will close case as no further interventions needed at this time.  Enzo Montgomery, RN,BSN,CCM [place] Management Telephonic Care Management Coordinator Direct Phone: [phone] Toll Free: [phone] Fax: [phone]

## 2018-09-27 ENCOUNTER — Ambulatory Visit: Payer: Self-pay

## 2018-10-11 ENCOUNTER — Ambulatory Visit (INDEPENDENT_AMBULATORY_CARE_PROVIDER_SITE_OTHER): Payer: Medicare Other | Admitting: Osteopathic Medicine

## 2018-10-11 ENCOUNTER — Encounter: Payer: Self-pay | Admitting: Osteopathic Medicine

## 2018-10-11 VITALS — BP 125/68 | HR 75 | Temp 98.5°F | Wt 187.8 lb

## 2018-10-11 DIAGNOSIS — R5382 Chronic fatigue, unspecified: Secondary | ICD-10-CM

## 2018-10-11 DIAGNOSIS — I609 Nontraumatic subarachnoid hemorrhage, unspecified: Secondary | ICD-10-CM | POA: Diagnosis not present

## 2018-10-11 DIAGNOSIS — Z9189 Other specified personal risk factors, not elsewhere classified: Secondary | ICD-10-CM | POA: Diagnosis not present

## 2018-10-11 DIAGNOSIS — G43001 Migraine without aura, not intractable, with status migrainosus: Secondary | ICD-10-CM

## 2018-10-11 MED ORDER — VARENICLINE TARTRATE 0.5 MG X 11 & 1 MG X 42 PO MISC: ORAL | 0 refills | Status: DC

## 2018-10-11 MED ORDER — VARENICLINE TARTRATE 1 MG PO TABS: 1.0000 mg | ORAL_TABLET | Freq: Two times a day (BID) | ORAL | 1 refills | Status: DC

## 2018-10-11 NOTE — Patient Instructions (Signed)
Steps to Quit Smoking  Smoking tobacco can be harmful to your health and can affect almost every organ in your body. Smoking puts you, and those around you, at risk for developing many serious chronic diseases. Quitting smoking is difficult, but it is one of the best things that you can do for your health. It is never too late to quit. What are the benefits of quitting smoking? When you quit smoking, you lower your risk of developing serious diseases and conditions, such as:  Lung cancer or lung disease, such as COPD.  Heart disease.  Stroke.  Heart attack.  Infertility.  Osteoporosis and bone fractures. Additionally, symptoms such as coughing, wheezing, and shortness of breath may get better when you quit. You may also find that you get sick less often because your body is stronger at fighting off colds and infections. If you are pregnant, quitting smoking can help to reduce your chances of having a baby of low birth weight. How do I get ready to quit? When you decide to quit smoking, create a plan to make sure that you are successful. Before you quit:  Pick a date to quit. Set a date within the next two weeks to give you time to prepare.  Write down the reasons why you are quitting. Keep this list in places where you will see it often, such as on your bathroom mirror or in your car or wallet.  Identify the people, places, things, and activities that make you want to smoke (triggers) and avoid them. Make sure to take these actions: ? Throw away all cigarettes at home, at work, and in your car. ? Throw away smoking accessories, such as ashtrays and lighters. ? Clean your car and make sure to empty the ashtray. ? Clean your home, including curtains and carpets.  Tell your family, friends, and coworkers that you are quitting. Support from your loved ones can make quitting easier.  Talk with your health care provider about your options for quitting smoking.  Find out what treatment  options are covered by your health insurance. What strategies can I use to quit smoking? Talk with your healthcare provider about different strategies to quit smoking. Some strategies include:  Quitting smoking altogether instead of gradually lessening how much you smoke over a period of time. Research shows that quitting "cold turkey" is more successful than gradually quitting.  Attending in-person counseling to help you build problem-solving skills. You are more likely to have success in quitting if you attend several counseling sessions. Even short sessions of 10 minutes can be effective.  Finding resources and support systems that can help you to quit smoking and remain smoke-free after you quit. These resources are most helpful when you use them often. They can include: ? Online chats with a counselor. ? Telephone quitlines. ? Printed self-help materials. ? Support groups or group counseling. ? Text messaging programs. ? Mobile phone applications.  Taking medicines to help you quit smoking. (If you are pregnant or breastfeeding, talk with your health care provider first.) Some medicines contain nicotine and some do not. Both types of medicines help with cravings, but the medicines that include nicotine help to relieve withdrawal symptoms. Your health care provider may recommend: ? Nicotine patches, gum, or lozenges. ? Nicotine inhalers or sprays. ? Non-nicotine medicine that is taken by mouth. Talk with your health care provider about combining strategies, such as taking medicines while you are also receiving in-person counseling. Using these two strategies together makes you   more likely to succeed in quitting than if you used either strategy on its own. If you are pregnant or breastfeeding, talk with your health care provider about finding counseling or other support strategies to quit smoking. Do not take medicine to help you quit smoking unless told to do so by your health care  provider. What things can I do to make it easier to quit? Quitting smoking might feel overwhelming at first, but there is a lot that you can do to make it easier. Take these important actions:  Reach out to your family and friends and ask that they support and encourage you during this time. Call telephone quitlines, reach out to support groups, or work with a counselor for support.  Ask people who smoke to avoid smoking around you.  Avoid places that trigger you to smoke, such as bars, parties, or smoke-break areas at work.  Spend time around people who do not smoke.  Lessen stress in your life, because stress can be a smoking trigger for some people. To lessen stress, try: ? Exercising regularly. ? Deep-breathing exercises. ? Yoga. ? Meditating. ? Performing a body scan. This involves closing your eyes, scanning your body from head to toe, and noticing which parts of your body are particularly tense. Purposefully relax the muscles in those areas.  Download or purchase mobile phone or tablet apps (applications) that can help you stick to your quit plan by providing reminders, tips, and encouragement. There are many free apps, such as QuitGuide from the CDC (Centers for Disease Control and Prevention). You can find other support for quitting smoking (smoking cessation) through smokefree.gov and other websites. How will I feel when I quit smoking? Within the first 24 hours of quitting smoking, you may start to feel some withdrawal symptoms. These symptoms are usually most noticeable 2-3 days after quitting, but they usually do not last beyond 2-3 weeks. Changes or symptoms that you might experience include:  Mood swings.  Restlessness, anxiety, or irritation.  Difficulty concentrating.  Dizziness.  Strong cravings for sugary foods in addition to nicotine.  Mild weight gain.  Constipation.  Nausea.  Coughing or a sore throat.  Changes in how your medicines work in your  body.  A depressed mood.  Difficulty sleeping (insomnia). After the first 2-3 weeks of quitting, you may start to notice more positive results, such as:  Improved sense of smell and taste.  Decreased coughing and sore throat.  Slower heart rate.  Lower blood pressure.  Clearer skin.  The ability to breathe more easily.  Fewer sick days. Quitting smoking is very challenging for most people. Do not get discouraged if you are not successful the first time. Some people need to make many attempts to quit before they achieve long-term success. Do your best to stick to your quit plan, and talk with your health care provider if you have any questions or concerns. This information is not intended to replace advice given to you by your health care provider. Make sure you discuss any questions you have with your health care provider. Document Released: 08/24/2001 Document Revised: 04/05/2017 Document Reviewed: 01/14/2015 Elsevier Interactive Patient Education  2019 [employer]  
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
## 2018-10-12 ENCOUNTER — Encounter: Payer: Self-pay | Admitting: Osteopathic Medicine

## 2018-10-12 DIAGNOSIS — Z9189 Other specified personal risk factors, not elsewhere classified: Secondary | ICD-10-CM

## 2018-10-12 HISTORY — DX: Other specified personal risk factors, not elsewhere classified: Z91.89

## 2018-10-13 ENCOUNTER — Telehealth: Payer: Self-pay | Admitting: Physician Assistant

## 2018-10-13 NOTE — Telephone Encounter (Signed)
Received fax from [employer] that Chantix requires a PA. Information has been sent to the insurance company. Awaiting determination.

## 2018-10-16 NOTE — Telephone Encounter (Addendum)
Received fax from [place] that Chantix has been approved through 04/11/2019. Pharmacy aware and form sent to scan.

## 2018-10-25 DIAGNOSIS — Z6833 Body mass index (BMI) 33.0-33.9, adult: Secondary | ICD-10-CM | POA: Diagnosis not present

## 2018-10-25 DIAGNOSIS — R030 Elevated blood-pressure reading, without diagnosis of hypertension: Secondary | ICD-10-CM | POA: Diagnosis not present

## 2018-10-25 DIAGNOSIS — I609 Nontraumatic subarachnoid hemorrhage, unspecified: Secondary | ICD-10-CM | POA: Diagnosis not present

## 2018-12-20 ENCOUNTER — Encounter (HOSPITAL_COMMUNITY): Payer: Self-pay | Admitting: Psychiatry

## 2018-12-20 ENCOUNTER — Ambulatory Visit (INDEPENDENT_AMBULATORY_CARE_PROVIDER_SITE_OTHER): Payer: Medicare Other | Admitting: Psychiatry

## 2018-12-20 DIAGNOSIS — F411 Generalized anxiety disorder: Secondary | ICD-10-CM

## 2018-12-20 DIAGNOSIS — F17210 Nicotine dependence, cigarettes, uncomplicated: Secondary | ICD-10-CM

## 2018-12-20 DIAGNOSIS — F5102 Adjustment insomnia: Secondary | ICD-10-CM | POA: Diagnosis not present

## 2018-12-20 DIAGNOSIS — F332 Major depressive disorder, recurrent severe without psychotic features: Secondary | ICD-10-CM

## 2018-12-20 MED ORDER — FLUOXETINE HCL 20 MG PO CAPS: ORAL_CAPSULE | ORAL | 0 refills | Status: DC
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
## 2019-01-04 ENCOUNTER — Other Ambulatory Visit (HOSPITAL_COMMUNITY): Payer: Self-pay | Admitting: Psychiatry

## 2019-01-10 ENCOUNTER — Ambulatory Visit: Payer: Self-pay | Admitting: Osteopathic Medicine

## 2019-02-23 ENCOUNTER — Other Ambulatory Visit (HOSPITAL_COMMUNITY): Payer: Self-pay | Admitting: Psychiatry

## 2019-03-09 IMAGING — MR MR HEAD WO/W CM
12 of 14 series · 40 of 48 positions shown · IV contrast (agent unspecified)
Comparison: CT a head 09/01/2018

CLINICAL DATA: Worst headache of life.  Subarachnoid hemorrhage.

EXAM:
MRI HEAD WITHOUT AND WITH CONTRAST
TECHNIQUE: Multiplanar, multiecho pulse sequences of the brain and surrounding
structures were obtained without and with intravenous contrast.
CONTRAST:  7 mL Gadovist IV

[Series 5: DWI · axial · 3.0mm · 0.88mm/px · z∈[-95,+53]mm · 9 of 104 slices shown (1 of 4)]
[im 1/104]
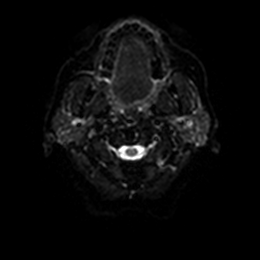
[im 13/104]
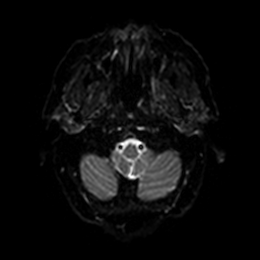
[im 26/104]
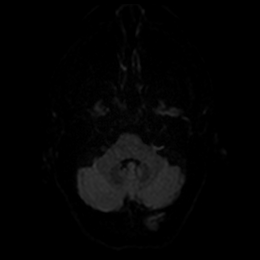
[im 39/104]
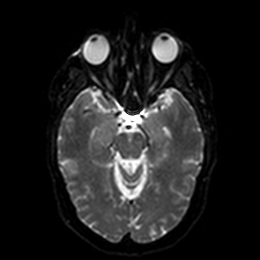
[im 52/104]
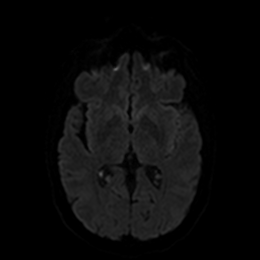
[im 65/104]
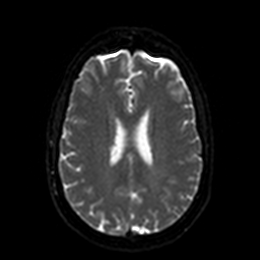
[im 78/104]
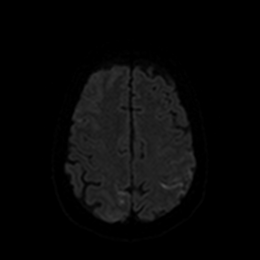
[im 91/104]
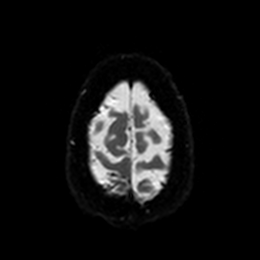
[im 104/104]
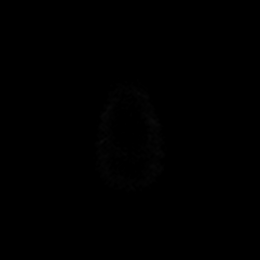

[Series 6: DWI · axial · 3.0mm · 0.88mm/px · z∈[-95,+53]mm · 5 of 52 slices shown (2 of 4)]
[im 1/52]
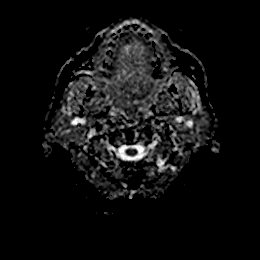
[im 13/52]
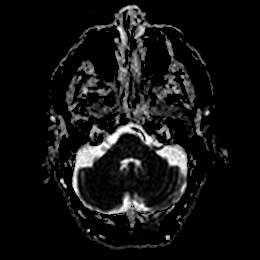
[im 26/52]
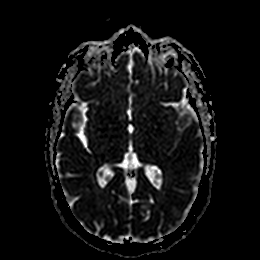
[im 39/52]
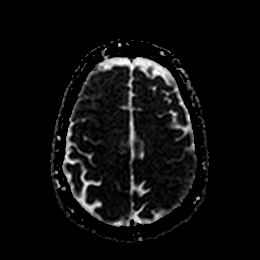
[im 52/52]
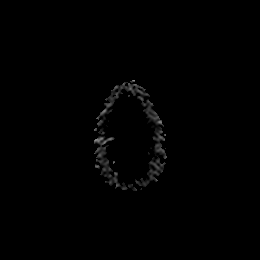

[Series 7: DWI · coronal · 4.0mm · 0.88mm/px · 5 of 72 slices shown (3 of 4)]
[im 1/72]
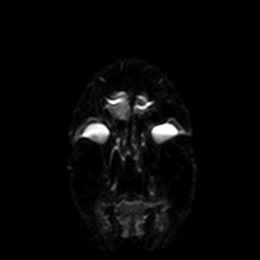
[im 18/72]
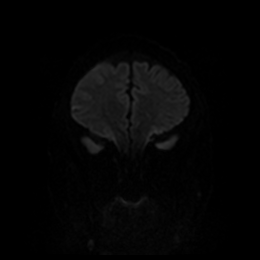
[im 36/72]
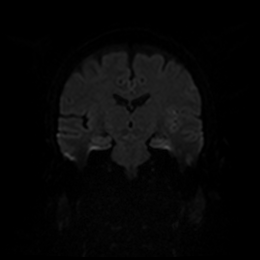
[im 54/72]
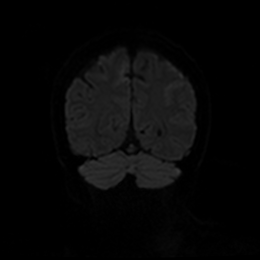
[im 72/72]
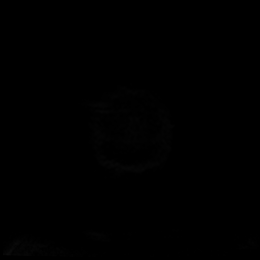

[Series 8: DWI · coronal · 4.0mm · 0.88mm/px · 3 of 36 slices shown (4 of 4)]
[im 1/36]
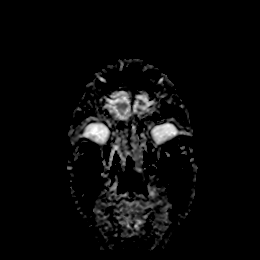
[im 18/36]
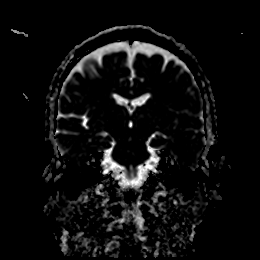
[im 36/36]
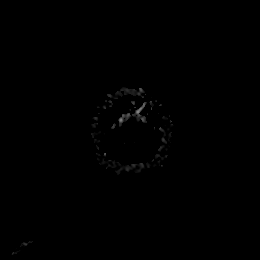

[Series 9: T1 · sagittal · 5.0mm · 0.75mm/px · 1 of 20 slices shown]
[im 1/20]
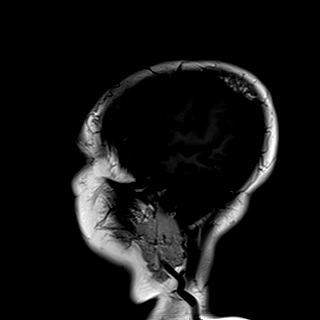

[Series 10: T2 · axial · 5.0mm · 0.72mm/px · z∈[-88,+52]mm · 2 of 25 slices shown]
[im 1/25]
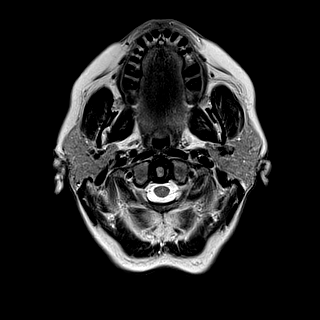
[im 25/25]
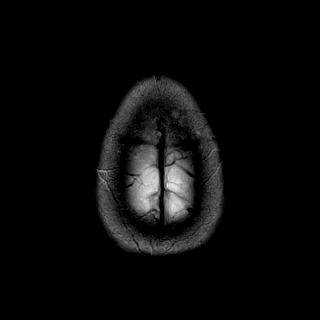

[Series 11: FLAIR · axial · 5.0mm · 0.45mm/px · z∈[-88,+52]mm · 2 of 25 slices shown]
[im 1/25]
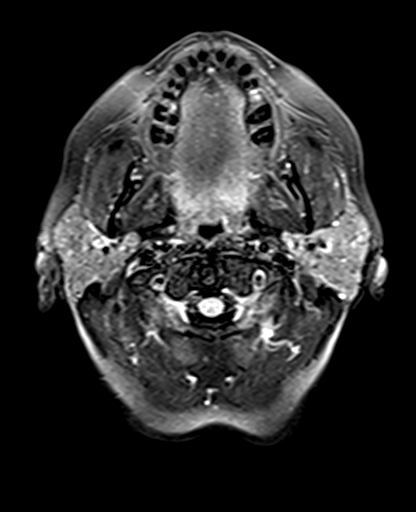
[im 25/25]
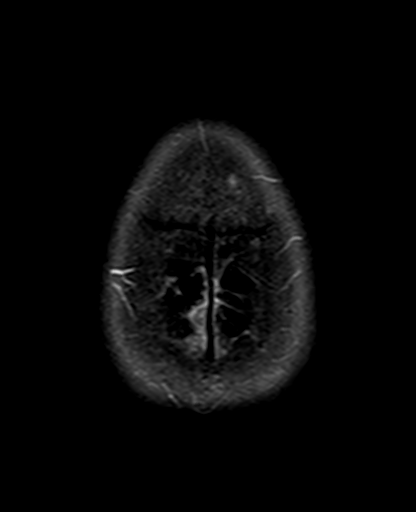

[Series 12: swi_images · axial · 3.0mm · 0.90mm/px · z∈[-97,+76]mm · 4 of 60 slices shown]
[im 1/60]
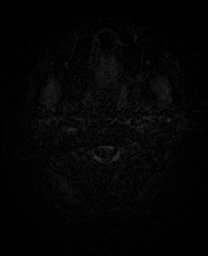
[im 20/60]
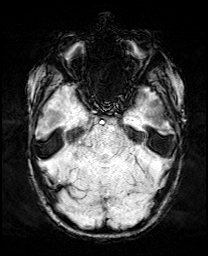
[im 40/60]
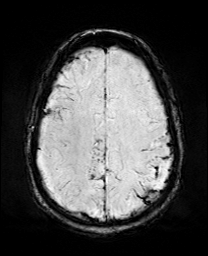
[im 60/60]
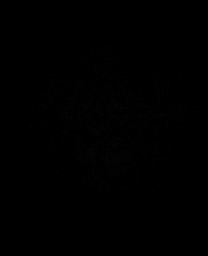

[Series 13: mip_images(sw) · axial · 24.0mm · 0.90mm/px · z∈[-87,+66]mm · 4 of 53 slices shown]
[im 1/53]
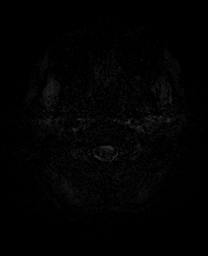
[im 18/53]
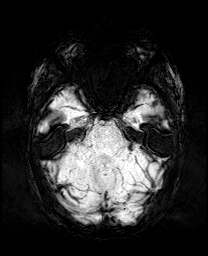
[im 35/53]
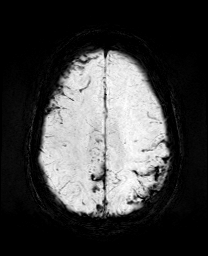
[im 53/53]
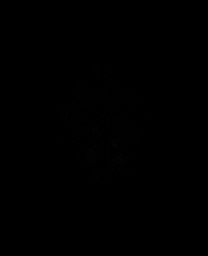

[Series 15: T2 post-contrast · coronal · 5.0mm · 0.72mm/px · 2 of 28 slices shown]
[im 1/28]
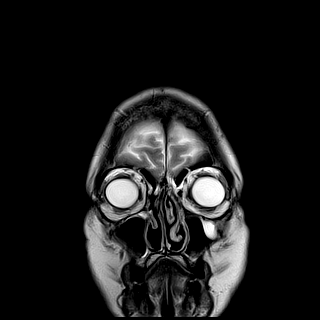
[im 28/28]
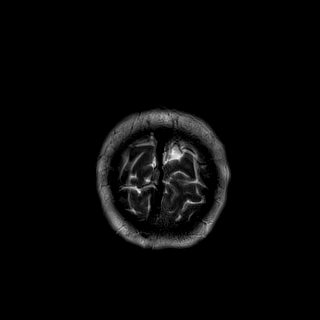

[Series 17: T1 post-contrast · coronal · 5.0mm · 0.34mm/px · 2 of 28 slices shown (1 of 2)]
[im 1/28]
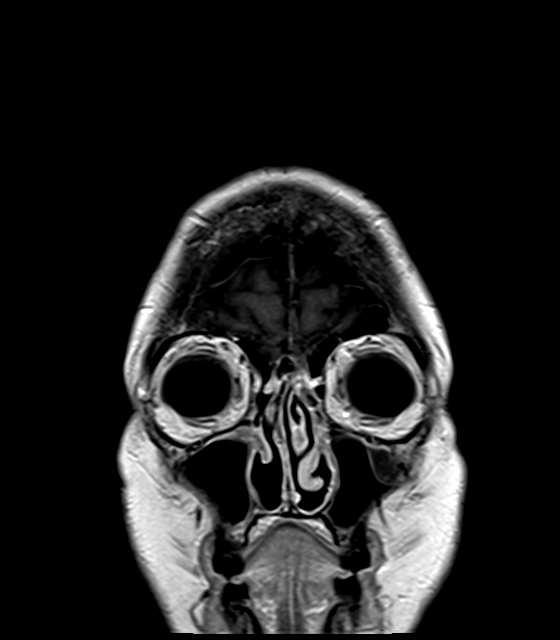
[im 28/28]
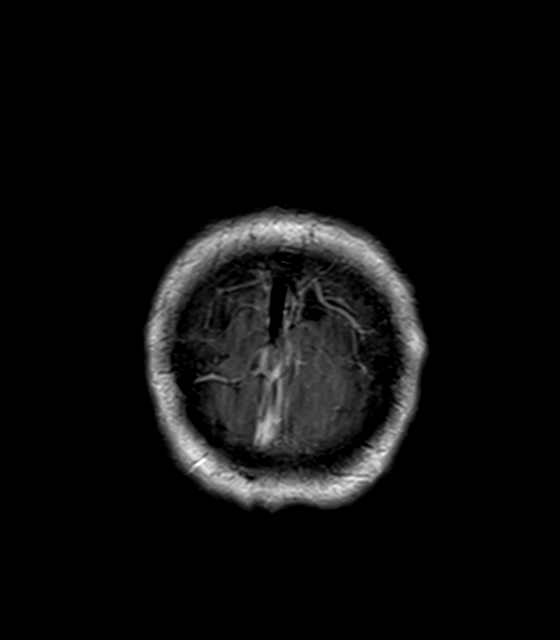

[Series 18: T1 post-contrast · sagittal · 5.0mm · 0.72mm/px · 1 of 20 slices shown (2 of 2)]
[im 1/20]
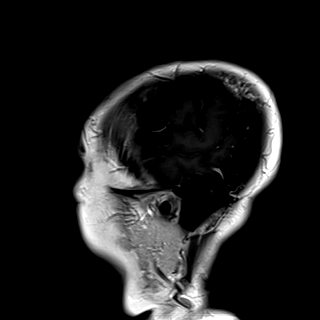

[40 of 48 positions shown; findings below may reference images not displayed]

FINDINGS: Brain: Ventricle size normal. Negative for acute infarct. Few small
subcortical white matter hyperintensities. Negative for mass

Subarachnoid hemorrhage over the convexity bilaterally appears
stable from CT yesterday. No blood in the basilar cisterns.

Vascular: Normal arterial flow voids none

Normal enhancement postcontrast imaging. No enhancing mass.
Leptomeningeal enhancement is normal.

Skull and upper cervical spine: Negative

Sinuses/Orbits: Mild mucosal edema paranasal sinuses.  Normal orbit

Other: None
IMPRESSION: Bilateral convexity subarachnoid hemorrhage unchanged from
yesterday. No aneurysm or vascular malformation identified on CTA.
Vascular narrowing noted on CTA suggestive of vasospasm or
vasculitis.

Constellation of clinical and imaging findings are very suggestive
of reversible cerebro vasoconstriction syndrome (RCVS). Vasculitis
not considered likely given the acute hemorrhage.

These results were called by telephone at the time of interpretation
on 09/02/2018 at [DATE] to Dr. Duffoo , who verbally acknowledged
these results.

## 2019-03-19 ENCOUNTER — Other Ambulatory Visit (HOSPITAL_COMMUNITY): Payer: Self-pay | Admitting: Psychiatry

## 2019-04-27 ENCOUNTER — Other Ambulatory Visit: Payer: Self-pay

## 2019-04-27 ENCOUNTER — Ambulatory Visit (INDEPENDENT_AMBULATORY_CARE_PROVIDER_SITE_OTHER): Payer: Medicare Other | Admitting: Psychiatry

## 2019-04-27 ENCOUNTER — Encounter (HOSPITAL_COMMUNITY): Payer: Self-pay | Admitting: Psychiatry

## 2019-04-27 DIAGNOSIS — F332 Major depressive disorder, recurrent severe without psychotic features: Secondary | ICD-10-CM | POA: Diagnosis not present

## 2019-04-27 DIAGNOSIS — F5102 Adjustment insomnia: Secondary | ICD-10-CM | POA: Diagnosis not present

## 2019-04-27 DIAGNOSIS — F411 Generalized anxiety disorder: Secondary | ICD-10-CM | POA: Diagnosis not present

## 2019-04-27 MED ORDER — OXCARBAZEPINE 150 MG PO TABS: 300.0000 mg | ORAL_TABLET | Freq: Every day | ORAL | 0 refills | Status: DC
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
## 2019-06-11 ENCOUNTER — Other Ambulatory Visit (HOSPITAL_COMMUNITY): Payer: Self-pay

## 2019-06-11 MED ORDER — FLUOXETINE HCL 20 MG PO CAPS: 20.0000 mg | ORAL_CAPSULE | Freq: Every day | ORAL | 3 refills | Status: DC

## 2019-07-05 ENCOUNTER — Other Ambulatory Visit (HOSPITAL_COMMUNITY): Payer: Self-pay | Admitting: Psychiatry

## 2019-07-31 ENCOUNTER — Ambulatory Visit: Payer: Medicare Other | Admitting: Cardiology

## 2019-08-31 ENCOUNTER — Other Ambulatory Visit (HOSPITAL_COMMUNITY): Payer: Self-pay

## 2019-08-31 MED ORDER — OXCARBAZEPINE 150 MG PO TABS: 300.0000 mg | ORAL_TABLET | Freq: Every day | ORAL | 0 refills | Status: DC

## 2019-09-24 ENCOUNTER — Encounter (HOSPITAL_COMMUNITY): Payer: Self-pay | Admitting: Psychiatry

## 2019-09-24 ENCOUNTER — Other Ambulatory Visit: Payer: Self-pay

## 2019-09-24 ENCOUNTER — Ambulatory Visit (INDEPENDENT_AMBULATORY_CARE_PROVIDER_SITE_OTHER): Payer: Medicare Other | Admitting: Psychiatry

## 2019-09-24 DIAGNOSIS — F411 Generalized anxiety disorder: Secondary | ICD-10-CM | POA: Diagnosis not present

## 2019-09-24 DIAGNOSIS — F332 Major depressive disorder, recurrent severe without psychotic features: Secondary | ICD-10-CM | POA: Diagnosis not present

## 2019-09-24 DIAGNOSIS — F5102 Adjustment insomnia: Secondary | ICD-10-CM | POA: Diagnosis not present

## 2019-09-24 MED ORDER — FLUOXETINE HCL 20 MG PO CAPS: 20.0000 mg | ORAL_CAPSULE | Freq: Every day | ORAL | 5 refills | Status: DC
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
## 2019-10-01 ENCOUNTER — Ambulatory Visit (INDEPENDENT_AMBULATORY_CARE_PROVIDER_SITE_OTHER): Payer: Medicare Other | Admitting: *Deleted

## 2019-10-01 VITALS — BP 103/72 | HR 77 | Temp 97.5°F | Ht 64.0 in | Wt 204.0 lb

## 2019-10-01 DIAGNOSIS — Z0000 Encounter for general adult medical examination without abnormal findings: Secondary | ICD-10-CM

## 2019-10-01 NOTE — Patient Instructions (Addendum)
Please schedule your next medicare wellness visit with me in 1 yr.  Jessica Lang , Thank you for taking time to come for your Medicare Wellness Visit. I appreciate your ongoing commitment to your health goals. Please review the following plan we discussed and let me know if I can assist you in the future.  Continue doing brain stimulating activities (puzzles, reading, adult coloring books, staying active) to keep memory sharp.  Make office visit for right hand swelling, red and warm to touch. Sent to schedule office visit. These are the goals we discussed: Goals    . Weight (lb) < 200 lb (90.7 kg)     WOuld like to loose 50 lbs.

## 2019-10-03 ENCOUNTER — Ambulatory Visit: Payer: Medicare Other | Admitting: Osteopathic Medicine

## 2019-11-16 ENCOUNTER — Encounter: Payer: Self-pay | Admitting: Emergency Medicine

## 2019-11-16 ENCOUNTER — Emergency Department (INDEPENDENT_AMBULATORY_CARE_PROVIDER_SITE_OTHER)
Admission: EM | Admit: 2019-11-16 | Discharge: 2019-11-16 | Disposition: A | Discharge status: Home / Self Care | Payer: Medicare Other | Source: Home / Self Care

## 2019-11-16 ENCOUNTER — Other Ambulatory Visit: Payer: Self-pay

## 2019-11-16 DIAGNOSIS — K029 Dental caries, unspecified: Secondary | ICD-10-CM

## 2019-11-16 DIAGNOSIS — K047 Periapical abscess without sinus: Secondary | ICD-10-CM

## 2019-11-16 MED ORDER — AMOXICILLIN-POT CLAVULANATE 875-125 MG PO TABS: 1.0000 | ORAL_TABLET | Freq: Two times a day (BID) | ORAL | 0 refills | Status: DC

## 2019-11-16 MED ORDER — CHLORHEXIDINE GLUCONATE 0.12% ORAL RINSE (MEDLINE KIT): 15.0000 mL | Freq: Two times a day (BID) | OROMUCOSAL | 0 refills | Status: DC

## 2019-11-16 NOTE — ED Triage Notes (Signed)
Pt states she has not taken any meds. She had a brain hemorrhage in 2019.

## 2019-11-16 NOTE — ED Triage Notes (Signed)
Pt states she is having pain in her left upper tooth that started yesterday. States her dentist can not see her until Tuesday/

## 2019-11-16 NOTE — Discharge Instructions (Signed)
  Please take antibiotics as prescribed and be sure to complete entire course even if you start to feel better to ensure infection does not come back.  Please call to schedule an appointment with your dentist to make sure the infection is treated completely.
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
## 2019-11-27 ENCOUNTER — Other Ambulatory Visit (HOSPITAL_COMMUNITY): Payer: Self-pay

## 2019-11-27 MED ORDER — OXCARBAZEPINE 150 MG PO TABS: 300.0000 mg | ORAL_TABLET | Freq: Every day | ORAL | 0 refills | Status: DC

## 2019-12-28 ENCOUNTER — Telehealth (INDEPENDENT_AMBULATORY_CARE_PROVIDER_SITE_OTHER): Payer: Medicare Other | Admitting: Nurse Practitioner

## 2019-12-28 ENCOUNTER — Other Ambulatory Visit: Payer: Self-pay

## 2019-12-28 ENCOUNTER — Encounter: Payer: Self-pay | Admitting: Nurse Practitioner

## 2019-12-28 VITALS — BP 127/72 | HR 62 | Temp 97.6°F

## 2019-12-28 DIAGNOSIS — J441 Chronic obstructive pulmonary disease with (acute) exacerbation: Secondary | ICD-10-CM

## 2019-12-28 MED ORDER — BUDESONIDE 0.25 MG/2ML IN SUSP: 0.2500 mg | Freq: Two times a day (BID) | RESPIRATORY_TRACT | 12 refills | Status: DC

## 2019-12-28 MED ORDER — PREDNISONE 50 MG PO TABS: 50.0000 mg | ORAL_TABLET | Freq: Every day | ORAL | 0 refills | Status: DC

## 2019-12-28 NOTE — Progress Notes (Signed)
Virtual Visit via Telephone Note  I connected with  Jessica Lang on 12/28/19 at  1:10 PM EDT by telephone and verified that I am speaking with the correct person using two identifiers.   I discussed the limitations, risks, security and privacy concerns of performing an evaluation and management service by telephone and the availability of in person appointments. I also discussed with the patient that there may be a patient responsible charge related to this service. The patient expressed understanding and agreed to proceed.  The patient is: At home I am: In the office  Subjective:    CC: Increased breathing difficulties recognized by friend overnight.  HPI: Jessica Lang is a pleasant 59 year old female presenting today with concerns after sleeping over at a friend's house last night.  She reports her friend informed her this morning that she was wheezing and experiencing raspy breathing throughout the night.  She states he was concerned that she was going to stop breathing and therefore he sat up to make sure she was okay.  She did not have any periods of apnea or snoring.    She does sleep propped up with pillows to help her sleep better.  And she has reported twice in the last several weeks she has woke up and checked her oxygen saturations once it was 82% and one other time it was 87%.  During the day she reports her oxygen saturations are in the high 90s.    She does report that she has significant seasonal allergies and they are exacerbated at this time causing a postnasal drip.  She also reports a productive cough with a white to gray-colored phlegm, although this is not an increase from her baseline.  She reports that during the day she is not experiencing any unusual symptoms.  She does have shortness of breath with exertion at baseline, although she is not tripoding or gasping for air. she denies fever, chills, dizziness, palpitations, chest pain, confusion.   She is currently using  ipratropium bromide and albuterol sulfate nebulizers for her breathing and she takes Claritin or Zyrtec for her allergy symptoms daily.Marland Kitchen  She was once before on budesonide nebulizer but she is no longer taking that.  She is an every day smoker but is working to quit.  At this time she is down to 10 cigarettes or less a day.  She was using Chantix successfully in the past however she had a stroke and can no longer take this medication.  Past medical history, Surgical history, Family history not pertinant except as noted below, Social history, Allergies, and medications have been entered into the medical record, reviewed, and corrections made.   Review of Systems:  See HPI for pertinent positive and negatives.   Objective:    General: Speaking clearly in complete sentences with intermittent coughing but no obvious shortness of breath.  Alert and oriented x3.  Normal judgment. No apparent acute distress.  Impression and Recommendations:   1. COPD exacerbation (HCC) Symptoms and presentation consistent with COPD exacerbation possibly due to increased allergens.  We will trial a 5-day prednisone burst and add budesonide nebulizer solution twice a day to her daily regimen. Instructions were provided to rinse the mouth out following utilization of the budesonide. Patient instructed to contact the office or seek emergency evaluation if she experiences significant shortness of breath, periods of apnea, oxygen saturations below 87% that are not improved, signs or symptoms of infection. Patient to follow-up if symptoms worsen or fail to improve. It would  be beneficial to see her in the office in the near future for evaluation of breathing. - budesonide (PULMICORT) 0.25 MG/2ML nebulizer solution; Take 2 mLs (0.25 mg total) by nebulization 2 (two) times daily.  Dispense: 60 mL; Refill: 12 - predniSONE (DELTASONE) 50 MG tablet; Take 1 tablet (50 mg total) by mouth daily with breakfast.  Dispense: 5 tablet;  Refill: 0     I discussed the assessment and treatment plan with the patient. The patient was provided an opportunity to ask questions and all were answered. The patient agreed with the plan and demonstrated an understanding of the instructions.   The patient was advised to call back or seek an in-person evaluation if the symptoms worsen or if the condition fails to improve as anticipated.  I provided 12 minutes of non-face-to-face time during this TELEPHONE encounter.    Orma Render, NP

## 2019-12-28 NOTE — Patient Instructions (Signed)

## 2020-02-26 ENCOUNTER — Ambulatory Visit (INDEPENDENT_AMBULATORY_CARE_PROVIDER_SITE_OTHER): Payer: Medicare Other | Admitting: Osteopathic Medicine

## 2020-02-26 ENCOUNTER — Encounter: Payer: Self-pay | Admitting: Osteopathic Medicine

## 2020-02-26 VITALS — BP 118/79 | HR 60 | Temp 96.0°F | Wt 188.0 lb

## 2020-02-26 DIAGNOSIS — R5382 Chronic fatigue, unspecified: Secondary | ICD-10-CM

## 2020-02-26 DIAGNOSIS — R001 Bradycardia, unspecified: Secondary | ICD-10-CM

## 2020-02-26 LAB — CBC
HCT: 46.9 % — ABNORMAL HIGH (ref 35.0–45.0)
Hemoglobin: 15.9 g/dL — ABNORMAL HIGH (ref 11.7–15.5)
MCH: 32.1 pg (ref 27.0–33.0)
MCHC: 33.9 g/dL (ref 32.0–36.0)
MCV: 94.6 fL (ref 80.0–100.0)
MPV: 10.3 fL (ref 7.5–12.5)
Platelets: 207 10*3/uL (ref 140–400)
RBC: 4.96 10*6/uL (ref 3.80–5.10)
RDW: 12.5 % (ref 11.0–15.0)
WBC: 6.6 10*3/uL (ref 3.8–10.8)

## 2020-02-26 LAB — COMPLETE METABOLIC PANEL WITH GFR
AG Ratio: 2.1 (calc) (ref 1.0–2.5)
ALT: 15 U/L (ref 6–29)
AST: 16 U/L (ref 10–35)
Albumin: 4.7 g/dL (ref 3.6–5.1)
Alkaline phosphatase (APISO): 68 U/L (ref 37–153)
BUN: 18 mg/dL (ref 7–25)
CO2: 29 mmol/L (ref 20–32)
Calcium: 10.2 mg/dL (ref 8.6–10.4)
Chloride: 104 mmol/L (ref 98–110)
Creat: 0.79 mg/dL (ref 0.50–1.05)
GFR, Est African American: 96 mL/min/{1.73_m2} (ref >=60)
GFR, Est Non African American: 83 mL/min/{1.73_m2} (ref >=60)
Globulin: 2.2 g/dL (calc) (ref 1.9–3.7)
Glucose, Bld: 110 mg/dL — ABNORMAL HIGH (ref 65–99)
Potassium: 4.8 mmol/L (ref 3.5–5.3)
Sodium: 142 mmol/L (ref 135–146)
Total Bilirubin: 0.4 mg/dL (ref 0.2–1.2)
Total Protein: 6.9 g/dL (ref 6.1–8.1)

## 2020-02-26 LAB — TSH: TSH: 1.16 mIU/L (ref 0.40–4.50)

## 2020-02-26 LAB — MAGNESIUM: Magnesium: 2.4 mg/dL (ref 1.5–2.5)

## 2020-02-26 NOTE — Progress Notes (Signed)
Jessica Lang is a 59 y.o. female who presents to  [place] at [facility]  today, 02/26/20, seeking care for the following: . Low heart rate on Friday, into 40's - 50's, at rest, feeling fatigued. No LOC, no orthostatic dizziness, no CP/SOB. Better today. Previous w/u 02/2018 non-revealing.      ASSESSMENT & PLAN with other pertinent history/findings:  The primary encounter diagnosis was Bradycardia. A diagnosis of Chronic fatigue was also pertinent to this visit.  EKG interpretation: Rate: 53 Rhythm: sinus No ST/T changes concerning for acute ischemia/infarct  Previous EKG 02/20/2018 no significant change   On exam: Reg rhyth, rate about 50-55, normal S1S2, no murmur  Will get set up with cardiology again - pt concerned about relation of bradycardia to her hx subarachnoid hemorrhage, I doubt any connection but would confirm w/ cardiology/neurosurgery. Pt is establish w/ both these specialists, will fwd this note to cardiology.   Patient Instructions  Call Dr Geraldo Pitter - cardiology - I'll send his office a note also Call Neurosurgeon to arrange follow up re: subarachnoid hemorrhage       Orders Placed This Encounter  Procedures  . CBC  . COMPLETE METABOLIC PANEL WITH GFR  . TSH  . Magnesium  . EKG 12-Lead    No orders of the defined types were placed in this encounter.      Follow-up instructions: Return for RECHECK PENDING RESULTS / IF WORSE OR CHANGE.                                         BP 118/79 (BP Location: Left Arm, Patient Position: Sitting)   Pulse 60   Temp (!) 96 F (35.6 C)   Wt 188 lb (85.3 kg)   BMI 32.27 kg/m   Current Meds  Medication Sig  . budesonide (PULMICORT) 0.25 MG/2ML nebulizer solution Take 2 mLs (0.25 mg total) by nebulization 2 (two) times daily.  Marland Kitchen FLUoxetine (PROZAC) 20 MG capsule Take 1 capsule (20 mg total) by mouth daily.  Marland Kitchen  ipratropium-albuterol (DUONEB) 0.5-2.5 (3) MG/3ML SOLN Take 3 mLs by nebulization every 4 (four) hours as needed (SOB/wheezing). Dx: J20.9, J84.9, J44.1  . Magnesium 500 MG TABS Take 1,000 mg by mouth daily.  . OXcarbazepine (TRILEPTAL) 150 MG tablet Take 2 tablets (300 mg total) by mouth at bedtime.  Marland Kitchen POTASSIUM PO Take by mouth daily as needed.  Marland Kitchen Respiratory Therapy Supplies (NEBULIZER) DEVI Use with nebulized medicines as directed  . Respiratory Therapy Supplies (NEBULIZER/TUBING/MOUTHPIECE) KIT Use with nebulized medicines as directed  . rizatriptan (MAXALT) 10 MG tablet Take 1 tablet (10 mg total) by mouth as needed for migraine. May repeat in 2 hours if needed  . VITAMIN D, CHOLECALCIFEROL, PO Take 5,000 Units by mouth daily.  . Zinc 50 MG CAPS Take by mouth daily.   Current Facility-Administered Medications for the 02/26/20 encounter (Office Visit) with Emeterio Reeve, DO  Medication  . promethazine (PHENERGAN) injection 25 mg    No results found for this or any previous visit (from the past 42 hour(s)).  No results found.     All questions at time of visit were answered - patient instructed to contact office with any additional concerns or updates.  ER/RTC precautions were reviewed with the patient.  Please note: voice recognition software was used to produce this document, and typos may escape review.  Please contact Dr. Sheppard Coil for any needed clarifications.

## 2020-02-26 NOTE — Patient Instructions (Addendum)
Call Dr Geraldo Pitter - cardiology - I'll send his office a note also Call Neurosurgeon to arrange follow up re: subarachnoid hemorrhage

## 2020-03-03 ENCOUNTER — Other Ambulatory Visit (HOSPITAL_COMMUNITY): Payer: Self-pay | Admitting: Psychiatry

## 2020-03-07 ENCOUNTER — Telehealth: Payer: Self-pay | Admitting: Cardiology

## 2020-03-07 NOTE — Telephone Encounter (Signed)
Pt advised of Dr. Julien Nordmann  Recommendation. Pt verbalized understanding and had no additional questions.

## 2020-03-07 NOTE — Telephone Encounter (Signed)
Pt states that her heart rate has been dropping into the upper 40's again but her bp is ok at 107/72. Pt states that she has not had this issue since her brain bleed in 08/2018. Pt states that she is feeling tired and fatigued. Pt has not been seen by you since 03/2018. She saw her PCP 02/28/20 for same. Pt has an appt with you on 04/09/20.  How do you advise?

## 2020-03-07 NOTE — Telephone Encounter (Signed)
She needs to speak to her primary care physician about this ASAP.

## 2020-03-07 NOTE — Telephone Encounter (Signed)
° ° °  STAT if HR is under 50 or over 120 (normal HR is 60-100 beats per minute)  1) What is your heart rate? 48  2) Do you have a log of your heart rate readings (document readings)?   3) Do you have any other symptoms? Pt said she's been having Bradycardia again. She feeling weak and been falling asleep a lot.

## 2020-03-21 ENCOUNTER — Telehealth (INDEPENDENT_AMBULATORY_CARE_PROVIDER_SITE_OTHER): Payer: Medicare Other | Admitting: Psychiatry

## 2020-03-21 ENCOUNTER — Encounter (HOSPITAL_COMMUNITY): Payer: Self-pay | Admitting: Psychiatry

## 2020-03-21 DIAGNOSIS — F332 Major depressive disorder, recurrent severe without psychotic features: Secondary | ICD-10-CM | POA: Diagnosis not present

## 2020-03-21 DIAGNOSIS — F5102 Adjustment insomnia: Secondary | ICD-10-CM

## 2020-03-21 DIAGNOSIS — F411 Generalized anxiety disorder: Secondary | ICD-10-CM

## 2020-03-21 MED ORDER — FLUOXETINE HCL 20 MG PO CAPS: 20.0000 mg | ORAL_CAPSULE | Freq: Every day | ORAL | 3 refills | Status: DC
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
## 2020-04-08 DIAGNOSIS — F4310 Post-traumatic stress disorder, unspecified: Secondary | ICD-10-CM | POA: Insufficient documentation

## 2020-04-08 DIAGNOSIS — F32A Depression, unspecified: Secondary | ICD-10-CM | POA: Insufficient documentation

## 2020-04-09 ENCOUNTER — Ambulatory Visit (INDEPENDENT_AMBULATORY_CARE_PROVIDER_SITE_OTHER): Payer: Medicare Other | Admitting: Cardiology

## 2020-04-09 ENCOUNTER — Other Ambulatory Visit: Payer: Self-pay

## 2020-04-09 ENCOUNTER — Encounter: Payer: Self-pay | Admitting: Cardiology

## 2020-04-09 VITALS — BP 118/78 | HR 54 | Ht 64.0 in | Wt 193.0 lb

## 2020-04-09 DIAGNOSIS — I498 Other specified cardiac arrhythmias: Secondary | ICD-10-CM | POA: Diagnosis not present
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
## 2020-04-09 NOTE — Patient Instructions (Signed)
Medication Instructions:  Your physician recommends that you continue on your current medications as directed. Please refer to the Current Medication list given to you today.  *If you need a refill on your cardiac medications before your next appointment, please call your pharmacy*  Lab Work: NONE If you have labs (blood work) drawn today and your tests are completely normal, you will receive your results only by: Marland Kitchen MyChart Message (if you have MyChart) OR . A paper copy in the mail If you have any lab test that is abnormal or we need to change your treatment, we will call you to review the results.  Testing/Procedures: Your physician has recommended that you wear an 3 DAY event monitor. Event monitors are medical devices that record the heart's electrical activity. Doctors most often [place] these monitors to diagnose arrhythmias. Arrhythmias are problems with the speed or rhythm of the heartbeat. The monitor is a small, portable device. You can wear one while you do your normal daily activities. This is usually used to diagnose what is causing palpitations/syncope (passing out).  ZIO XT- Long Term Monitor Instructions   Your physician has requested you wear your ZIO patch monitor_______days.   This is a single patch monitor.  [employer] supplies one patch monitor per enrollment.  Additional stickers are not available.   Please do not apply patch if you will be having a Nuclear Stress Test, Echocardiogram, Cardiac CT, MRI, or Chest Xray during the time frame you would be wearing the monitor. The patch cannot be worn during these tests.  You cannot remove and re-apply the ZIO XT patch monitor.   Your ZIO patch monitor will be sent USPS Priority mail from [facility] directly to your home address. The monitor may also be mailed to a PO BOX if home delivery is not available.   It may take 3-5 days to receive your monitor after you have been enrolled.   Once you have received you monitor,  please review enclosed instructions.  Your monitor has already been registered assigning a specific monitor serial # to you.   Applying the monitor   Shave hair from upper left chest.   Hold abrader disc by orange tab.  Rub abrader in 40 strokes over left upper chest as indicated in your monitor instructions.   Clean area with 4 enclosed alcohol pads .  Use all pads to assure are is cleaned thoroughly.  Let dry.   Apply patch as indicated in monitor instructions.  Patch will be place under collarbone on left side of chest with arrow pointing upward.   Rub patch adhesive wings for 2 minutes.Remove white label marked "1".  Remove white label marked "2".  Rub patch adhesive wings for 2 additional minutes.   While looking in a mirror, press and release button in center of patch.  A small green light will flash 3-4 times .  This will be your only indicator the monitor has been turned on.     Do not shower for the first 24 hours.  You may shower after the first 24 hours.   Press button if you feel a symptom. You will hear a small click.  Record Date, Time and Symptom in the Patient Log Book.   When you are ready to remove patch, follow instructions on last 2 pages of Patient Log Book.  Stick patch monitor onto last page of Patient Log Book.   Place Patient Log Book in [place] box.  Use locking tab on box and  tape box closed securely.  The [employer] has [employer] on it.  Please place in mailbox as soon as possible.  Your physician should have your test results approximately 7 days after the monitor has been mailed back to [facility].   Call [facility] at [phone] if you have questions regarding your ZIO XT patch monitor.  Call them immediately if you see an orange light blinking on your monitor.   If your monitor falls off in less than 4 days contact our Monitor department at [phone].  If your monitor becomes loose or falls off after 4 days call  [employer] at [phone] for suggestions on securing your monitor.   Follow-Up: At [facility], you and your health needs are our priority.  As part of our continuing mission to provide you with exceptional heart care, we have created designated Provider Care Teams.  These Care Teams include your primary Cardiologist (physician) and Advanced Practice Providers (APPs -  Physician Assistants and Nurse Practitioners) who all work together to provide you with the care you need, when you need it.  We recommend signing up for the patient portal called "MyChart".  Sign up information is provided on this After Visit Summary.  MyChart is used to connect with patients for Virtual Visits (Telemedicine).  Patients are able to view lab/test results, encounter notes, upcoming appointments, etc.  Non-urgent messages can be sent to your provider as well.   To learn more about what you can do with MyChart, go to [email or web address].    Your next appointment:   6 month(s)  The format for your next appointment:   In Person  Provider:   Jyl Heinz, MD

## 2020-04-30 ENCOUNTER — Ambulatory Visit (INDEPENDENT_AMBULATORY_CARE_PROVIDER_SITE_OTHER): Payer: Medicare Other

## 2020-04-30 DIAGNOSIS — I498 Other specified cardiac arrhythmias: Secondary | ICD-10-CM

## 2020-04-30 DIAGNOSIS — R001 Bradycardia, unspecified: Secondary | ICD-10-CM | POA: Diagnosis not present

## 2020-05-02 ENCOUNTER — Encounter: Payer: Self-pay | Admitting: Osteopathic Medicine

## 2020-05-02 DIAGNOSIS — Z1211 Encounter for screening for malignant neoplasm of colon: Secondary | ICD-10-CM

## 2020-05-05 ENCOUNTER — Encounter: Payer: Self-pay | Admitting: Osteopathic Medicine

## 2020-05-16 NOTE — Telephone Encounter (Signed)
This encounter was created in error - please disregard.

## 2020-05-19 DIAGNOSIS — R001 Bradycardia, unspecified: Secondary | ICD-10-CM | POA: Diagnosis not present

## 2020-07-24 ENCOUNTER — Telehealth (INDEPENDENT_AMBULATORY_CARE_PROVIDER_SITE_OTHER): Payer: Medicare Other | Admitting: Family Medicine

## 2020-07-24 ENCOUNTER — Encounter: Payer: Self-pay | Admitting: Family Medicine

## 2020-07-24 VITALS — BP 157/89 | HR 64 | Temp 97.9°F | Wt 184.0 lb

## 2020-07-24 DIAGNOSIS — R197 Diarrhea, unspecified: Secondary | ICD-10-CM | POA: Diagnosis not present

## 2020-07-24 MED ORDER — ONDANSETRON 4 MG PO TBDP: 4.0000 mg | ORAL_TABLET | Freq: Three times a day (TID) | ORAL | 0 refills | Status: DC | PRN

## 2020-07-24 NOTE — Assessment & Plan Note (Signed)
C. Diff, stool culture, O&P ordered Rx for zofran as needed for nausea.  Recommend bland diet and increased fluids.   Red flags reviewed including development of pain, blood in stool or vomit, or signs of dehydration.
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
## 2020-07-24 NOTE — Progress Notes (Signed)
Started 2 -3 days Nausea Loss of appetite Diarrhea  Drank ginger ale. Unaware of what to take for nausea. Being treated for bradycardia. Cardiologist does not seem concerned.  Pulse down in the 40's. Causes her to sleep all day over the past 2 years.

## 2020-07-27 NOTE — Addendum Note (Signed)
Addended by: Perlie Mayo on: 07/27/2020 05:32 PM   Modules accepted: Level of Service

## 2020-07-28 DIAGNOSIS — R197 Diarrhea, unspecified: Secondary | ICD-10-CM | POA: Diagnosis not present

## 2020-08-04 LAB — C. DIFFICILE GDH AND TOXIN A/B
GDH ANTIGEN: NOT DETECTED
MICRO NUMBER:: 11205225
SPECIMEN QUALITY:: ADEQUATE
TOXIN A AND B: NOT DETECTED

## 2020-08-04 LAB — SALMONELLA/SHIGELLA CULT, CAMPY EIA AND SHIGA TOXIN RFL ECOLI
MICRO NUMBER: 11204543
MICRO NUMBER:: 11204544
MICRO NUMBER:: 11204545
Result:: NOT DETECTED
SHIGA RESULT:: NOT DETECTED
SPECIMEN QUALITY: ADEQUATE
SPECIMEN QUALITY:: ADEQUATE
SPECIMEN QUALITY:: ADEQUATE

## 2020-08-04 LAB — OVA AND PARASITE EXAMINATION
CONCENTRATE RESULT:: NONE SEEN
MICRO NUMBER:: 11204092
SPECIMEN QUALITY:: ADEQUATE
TRICHROME RESULT:: NONE SEEN

## 2020-09-02 ENCOUNTER — Ambulatory Visit (INDEPENDENT_AMBULATORY_CARE_PROVIDER_SITE_OTHER): Payer: Medicare Other | Admitting: Osteopathic Medicine

## 2020-09-02 ENCOUNTER — Other Ambulatory Visit: Payer: Self-pay

## 2020-09-02 ENCOUNTER — Encounter: Payer: Self-pay | Admitting: Osteopathic Medicine

## 2020-09-02 ENCOUNTER — Ambulatory Visit (INDEPENDENT_AMBULATORY_CARE_PROVIDER_SITE_OTHER): Payer: Medicare Other

## 2020-09-02 ENCOUNTER — Ambulatory Visit: Payer: Medicare Other

## 2020-09-02 VITALS — BP 131/81 | HR 63 | Temp 98.1°F | Wt 188.7 lb

## 2020-09-02 DIAGNOSIS — R2231 Localized swelling, mass and lump, right upper limb: Secondary | ICD-10-CM

## 2020-09-02 DIAGNOSIS — R2242 Localized swelling, mass and lump, left lower limb: Secondary | ICD-10-CM

## 2020-09-02 DIAGNOSIS — R221 Localized swelling, mass and lump, neck: Secondary | ICD-10-CM | POA: Diagnosis not present

## 2020-09-02 DIAGNOSIS — R229 Localized swelling, mass and lump, unspecified: Secondary | ICD-10-CM

## 2020-09-02 DIAGNOSIS — M25552 Pain in left hip: Secondary | ICD-10-CM | POA: Diagnosis not present

## 2020-09-02 DIAGNOSIS — R2241 Localized swelling, mass and lump, right lower limb: Secondary | ICD-10-CM | POA: Diagnosis not present

## 2020-09-02 DIAGNOSIS — M25551 Pain in right hip: Secondary | ICD-10-CM | POA: Diagnosis not present

## 2020-09-02 NOTE — Patient Instructions (Signed)
Will get you set up for ultrasound to confirm presence of likely lipoma (benign fatty tumors under the skin).  Will refer to general surgery to discuss removal of the masses.     Lipoma  A lipoma is a noncancerous (benign) tumor that is made up of fat cells. This is a very common type of soft-tissue growth. Lipomas are usually found under the skin (subcutaneous). They may occur in any tissue of the body that contains fat. Common areas for lipomas to appear include the back, arms, shoulders, buttocks, and thighs. Lipomas grow slowly, and they are usually painless. Most lipomas do not cause problems and do not require treatment. What are the causes? The cause of this condition is not known. What increases the risk? You are more likely to develop this condition if:  You are 45-7 years old.  You have a family history of lipomas. What are the signs or symptoms? A lipoma usually appears as a small, round bump under the skin. In most cases, the lump will:  Feel soft or rubbery.  Not cause pain or other symptoms. However, if a lipoma is located in an area where it pushes on nerves, it can become painful or cause other symptoms. How is this diagnosed? A lipoma can usually be diagnosed with a physical exam. You may also have tests to confirm the diagnosis and to rule out other conditions. Tests may include:  Imaging tests, such as a CT scan or an MRI.  Removal of a tissue sample to be looked at under a microscope (biopsy). How is this treated? Treatment for this condition depends on the size of the lipoma and whether it is causing any symptoms.  For small lipomas that are not causing problems, no treatment is needed.  If a lipoma is bigger or it causes problems, surgery may be done to remove the lipoma. Lipomas can also be removed to improve appearance. Most often, the procedure is done after applying a medicine that numbs the area (local anesthetic).  Liposuction may be done to reduce  the size of the lipoma before it is removed through surgery, or it may be done to remove the lipoma. Lipomas are removed with this method in order to limit incision size and scarring. A liposuction tube is inserted through a small incision into the lipoma, and the contents of the lipoma are removed through the tube with suction. Follow these instructions at home:  Watch your lipoma for any changes.  Keep all follow-up visits as told by your health care provider. This is important. Contact a health care provider if:  Your lipoma becomes larger or hard.  Your lipoma becomes painful, red, or increasingly swollen. These could be signs of infection or a more serious condition. Get help right away if:  You develop tingling or numbness in an area near the lipoma. This could indicate that the lipoma is causing nerve damage. Summary  A lipoma is a noncancerous tumor that is made up of fat cells.  Most lipomas do not cause problems and do not require treatment.  If a lipoma is bigger or it causes problems, surgery may be done to remove the lipoma.  Contact a health care provider if your lipoma becomes larger or hard, or if it becomes painful, red, or increasingly swollen. Pain, redness, and swelling could be signs of infection or a more serious condition. This information is not intended to replace advice given to you by your health care provider. Make sure you discuss any  questions you have with your health care provider. Document Revised: 04/16/2019 Document Reviewed: 04/16/2019 Elsevier Patient Education  [place].

## 2020-09-02 NOTE — Progress Notes (Signed)
Jessica Lang is a 59 y.o. female who presents to  [facility]  today, 09/02/20, seeking care for the following:  . SKIN CONCERNS o R shoulder blade - has been there long-term - palpable likely lipoma o R deltoid area - noted few weeks ago - seems likely lipoma also on exam but feels a bit softer  o R hip - more painful, causing difficulty w/ walking, fullness under skin ?lipoma vs msk. unknown duration   o L hip - similar complaints to R area but less large/painful. Unknown duration      ASSESSMENT & PLAN with other pertinent findings:  The encounter diagnosis was Subcutaneous masses suspect lipoma R deltoid, R lateral scapula, R anterolateral hip/thigh, L anterolateral hip/thigh.    Patient Instructions  Will get you set up for ultrasound to confirm presence of likely lipoma (benign fatty tumors under the skin).  Will refer to general surgery to discuss removal of the masses.     Lipoma  A lipoma is a noncancerous (benign) tumor that is made up of fat cells. This is a very common type of soft-tissue growth. Lipomas are usually found under the skin (subcutaneous). They may occur in any tissue of the body that contains fat. Common areas for lipomas to appear include the back, arms, shoulders, buttocks, and thighs. Lipomas grow slowly, and they are usually painless. Most lipomas do not cause problems and do not require treatment. What are the causes? The cause of this condition is not known. What increases the risk? You are more likely to develop this condition if:  You are 22-37 years old.  You have a family history of lipomas. What are the signs or symptoms? A lipoma usually appears as a small, round bump under the skin. In most cases, the lump will:  Feel soft or rubbery.  Not cause pain or other symptoms. However, if a lipoma is located in an area where it pushes on nerves, it can become painful or cause other  symptoms. How is this diagnosed? A lipoma can usually be diagnosed with a physical exam. You may also have tests to confirm the diagnosis and to rule out other conditions. Tests may include:  Imaging tests, such as a CT scan or an MRI.  Removal of a tissue sample to be looked at under a microscope (biopsy). How is this treated? Treatment for this condition depends on the size of the lipoma and whether it is causing any symptoms.  For small lipomas that are not causing problems, no treatment is needed.  If a lipoma is bigger or it causes problems, surgery may be done to remove the lipoma. Lipomas can also be removed to improve appearance. Most often, the procedure is done after applying a medicine that numbs the area (local anesthetic).  Liposuction may be done to reduce the size of the lipoma before it is removed through surgery, or it may be done to remove the lipoma. Lipomas are removed with this method in order to limit incision size and scarring. A liposuction tube is inserted through a small incision into the lipoma, and the contents of the lipoma are removed through the tube with suction. Follow these instructions at home:  Watch your lipoma for any changes.  Keep all follow-up visits as told by your health care provider. This is important. Contact a health care provider if:  Your lipoma becomes larger or hard.  Your lipoma becomes painful, red, or increasingly swollen.  These could be signs of infection or a more serious condition. Get help right away if:  You develop tingling or numbness in an area near the lipoma. This could indicate that the lipoma is causing nerve damage. Summary  A lipoma is a noncancerous tumor that is made up of fat cells.  Most lipomas do not cause problems and do not require treatment.  If a lipoma is bigger or it causes problems, surgery may be done to remove the lipoma.  Contact a health care provider if your lipoma becomes larger or hard, or if  it becomes painful, red, or increasingly swollen. Pain, redness, and swelling could be signs of infection or a more serious condition. This information is not intended to replace advice given to you by your health care provider. Make sure you discuss any questions you have with your health care provider. Document Revised: 04/16/2019 Document Reviewed: 04/16/2019 Elsevier Patient Education  [place] This Encounter  Procedures  . US SOFT TISSUE RT UPPER EXTREMITY LTD (NON-VASCULAR)  . [place] LT LOWER EXTREM LTD SOFT TISSUE NON VASCULAR  . [place] RT LOWER EXTREM LTD SOFT TISSUE NON VASCULAR  . [place] RT UPPER EXTREM LTD SOFT TISSUE NON VASCULAR  . Ambulatory referral to General Surgery    No orders of the defined types were placed in this encounter.      Follow-up instructions: Return for RECHECK PENDING ULTRASOUND RESULTS / IF WORSE OR CHANGE.                                         BP 131/81   Pulse 63   Temp 98.1 F (36.7 C)   Wt 188 lb 11.2 oz (85.6 kg)   SpO2 93%   BMI 32.39 kg/m   Current Meds  Medication Sig  . FLUoxetine (PROZAC) 20 MG capsule Take 1 capsule (20 mg total) by mouth daily.  . Magnesium 500 MG TABS Take 1,000 mg by mouth daily.   . OXcarbazepine (TRILEPTAL) 150 MG tablet TAKE 2 TABLETS BY MOUTH AT BEDTIME.  Marland Kitchen POTASSIUM PO Take by mouth daily as needed.   Marland Kitchen VITAMIN D, CHOLECALCIFEROL, PO Take 5,000 Units by mouth daily.   . Zinc 50 MG CAPS Take by mouth daily.    Current Facility-Administered Medications for the 09/02/20 encounter (Office Visit) with Emeterio Reeve, DO  Medication  . promethazine (PHENERGAN) injection 25 mg    No results found for this or any previous visit (from the past 67 hour(s)).  No results found.     All questions at time of visit were answered - patient instructed to contact office with any additional concerns or updates.  ER/RTC precautions were reviewed with  the patient as applicable.   Please note: voice recognition software was used to produce this document, and typos may escape review. Please contact Dr. Sheppard Coil for any needed clarifications.

## 2020-09-03 ENCOUNTER — Encounter: Payer: Self-pay | Admitting: Osteopathic Medicine

## 2020-09-09 NOTE — Telephone Encounter (Signed)
LVM for patient to schedule an appt with Dr. Karie Schwalbe

## 2020-09-11 ENCOUNTER — Other Ambulatory Visit: Payer: Self-pay

## 2020-09-11 ENCOUNTER — Ambulatory Visit (INDEPENDENT_AMBULATORY_CARE_PROVIDER_SITE_OTHER): Payer: Medicare Other

## 2020-09-11 ENCOUNTER — Ambulatory Visit (INDEPENDENT_AMBULATORY_CARE_PROVIDER_SITE_OTHER): Payer: Medicare Other | Admitting: Sports Medicine

## 2020-09-11 DIAGNOSIS — E882 Lipomatosis, not elsewhere classified: Secondary | ICD-10-CM | POA: Diagnosis not present

## 2020-09-11 DIAGNOSIS — S73191A Other sprain of right hip, initial encounter: Secondary | ICD-10-CM | POA: Insufficient documentation

## 2020-09-11 DIAGNOSIS — M25551 Pain in right hip: Secondary | ICD-10-CM

## 2020-09-11 DIAGNOSIS — G8929 Other chronic pain: Secondary | ICD-10-CM | POA: Diagnosis not present

## 2020-09-11 DIAGNOSIS — D179 Benign lipomatous neoplasm, unspecified: Secondary | ICD-10-CM | POA: Insufficient documentation

## 2020-09-11 NOTE — Progress Notes (Addendum)
    Procedures performed today:    None.  Independent interpretation of notes and tests performed by another provider:   Ultrasounds personally reviewed and are consistent with simple lipomas.  There is definite spurring off of the acetabulum on the right, this can be seen in osteoarthritis, hip impingement syndrome, awaiting MRI.  Brief History, Exam, Impression, and Recommendations:    Right hip pain This is a pleasant 59 year old female, she has had 3 years of pain in her right hip localized in the groin. She has cutaneous lipomatosis, and does have a mass over the anterior right upper thigh. On exam the upper thigh mass does feel like a lipoma, it is however nontender and I do not think this is relevant, I am however able to reproduce her pain with internal rotation of her right hip as well as with FADIR sign. This all raises suspicion for hip arthritis/labral tear/avascular necrosis. We are going to need x-rays, and I am going to set her up for an MR arthrogram considering duration of symptoms, greater than 6 weeks in spite of physician directed conservative measures as well as mechanical symptoms. Unable to use NSAIDs due to prior subarachnoid hemorrhage. Return to see me for MR arthrogram injection.  Cutaneous lipomatosis Dotty does have significant lipomatosis, the largest of which appears to be on her posterior right shoulder, I am happy to remove this in the office at some point if it becomes symptomatic.    ___________________________________________ Ihor Austin. Benjamin Stain, M.D., ABFM., CAQSM. Primary Care and Sports Medicine [facility]  Adjunct Instructor of Family Medicine  [employer]

## 2020-09-11 NOTE — Assessment & Plan Note (Signed)
Beauty does have significant lipomatosis, the largest of which appears to be on her posterior right shoulder, I am happy to remove this in the office at some point if it becomes symptomatic.

## 2020-09-11 NOTE — Assessment & Plan Note (Addendum)
This is a pleasant 59 year old female, she has had 3 years of pain in her right hip localized in the groin. She has cutaneous lipomatosis, and does have a mass over the anterior right upper thigh. On exam the upper thigh mass does feel like a lipoma, it is however nontender and I do not think this is relevant, I am however able to reproduce her pain with internal rotation of her right hip as well as with FADIR sign. This all raises suspicion for hip arthritis/labral tear/avascular necrosis. We are going to need x-rays, and I am going to set her up for an MR arthrogram considering duration of symptoms, greater than 6 weeks in spite of physician directed conservative measures as well as mechanical symptoms. Unable to use NSAIDs due to prior subarachnoid hemorrhage. Return to see me for MR arthrogram injection.

## 2020-09-19 ENCOUNTER — Encounter (HOSPITAL_COMMUNITY): Payer: Self-pay | Admitting: Psychiatry

## 2020-09-19 ENCOUNTER — Telehealth (INDEPENDENT_AMBULATORY_CARE_PROVIDER_SITE_OTHER): Payer: Medicare Other | Admitting: Psychiatry

## 2020-09-19 DIAGNOSIS — F332 Major depressive disorder, recurrent severe without psychotic features: Secondary | ICD-10-CM

## 2020-09-19 DIAGNOSIS — F411 Generalized anxiety disorder: Secondary | ICD-10-CM | POA: Diagnosis not present

## 2020-09-19 MED ORDER — OXCARBAZEPINE 150 MG PO TABS: 300.0000 mg | ORAL_TABLET | Freq: Every day | ORAL | 1 refills | Status: DC

## 2020-09-19 MED ORDER — FLUOXETINE HCL 20 MG PO CAPS: 20.0000 mg | ORAL_CAPSULE | Freq: Every day | ORAL | 3 refills | Status: DC
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
## 2020-09-22 ENCOUNTER — Ambulatory Visit (INDEPENDENT_AMBULATORY_CARE_PROVIDER_SITE_OTHER): Payer: Medicare Other

## 2020-09-22 ENCOUNTER — Ambulatory Visit (INDEPENDENT_AMBULATORY_CARE_PROVIDER_SITE_OTHER): Payer: Medicare Other | Admitting: Sports Medicine

## 2020-09-22 ENCOUNTER — Other Ambulatory Visit: Payer: Self-pay

## 2020-09-22 DIAGNOSIS — M25451 Effusion, right hip: Secondary | ICD-10-CM | POA: Diagnosis not present

## 2020-09-22 DIAGNOSIS — M25551 Pain in right hip: Secondary | ICD-10-CM

## 2020-09-22 DIAGNOSIS — M24151 Other articular cartilage disorders, right hip: Secondary | ICD-10-CM

## 2020-09-22 DIAGNOSIS — S73191A Other sprain of right hip, initial encounter: Secondary | ICD-10-CM | POA: Diagnosis not present

## 2020-09-22 MED ORDER — GADOBUTROL 1 MMOL/ML IV SOLN
1.0000 mL | Freq: Once | INTRAVENOUS | Status: AC | PRN
  Administered 2020-09-22: 1 mL via INTRAVENOUS

## 2020-09-22 NOTE — Assessment & Plan Note (Signed)
Right hip joint arthrogram injection, please see prior assessment and plan for further details on impression and plan.

## 2020-09-22 NOTE — Progress Notes (Signed)
    Procedures performed today:    Procedure: Real-time Ultrasound Guided gadolinium contrast injection of right hip joint Device: Samsung HS60  Verbal informed consent obtained.  Time-out conducted.  Noted no overlying erythema, induration, or other signs of local infection.  Skin prepped in a sterile fashion.  Local anesthesia: Topical Ethyl chloride.  With sterile technique and under real time ultrasound guidance: I advanced a 22-gauge spinal needle to the femoral head/neck junction, contacted bone and injected 1 cc kenalog 40, 2 cc lidocaine, 2 cc bupivacaine, syringe switched and 0.1 cc gadolinium injected, syringe again switched and 10 cc sterile saline used to distend the joint. Joint visualized and capsule seen distending confirming intra-articular placement of contrast material and medication. Completed without difficulty  Advised to call if fevers/chills, erythema, induration, drainage, or persistent bleeding.  Images permanently stored in PACS Impression: Technically successful ultrasound guided gadolinium contrast injection for MR arthrography.  Please see separate MR arthrogram report.   Independent interpretation of notes and tests performed by another provider:   None.  Brief History, Exam, Impression, and Recommendations:    Right hip pain Right hip joint arthrogram injection, please see prior assessment and plan for further details on impression and plan.    ___________________________________________ Gwen Her. Dianah Field, M.D., ABFM., CAQSM. Primary Care and [place] Instructor of [place] of [facility]

## 2020-09-23 DIAGNOSIS — Z1211 Encounter for screening for malignant neoplasm of colon: Secondary | ICD-10-CM

## 2020-10-23 NOTE — Addendum Note (Signed)
Addended by: Narda Rutherford on: 10/23/2020 09:55 AM   Modules accepted: Orders

## 2020-10-24 DIAGNOSIS — R2231 Localized swelling, mass and lump, right upper limb: Secondary | ICD-10-CM | POA: Diagnosis not present

## 2020-10-24 DIAGNOSIS — R222 Localized swelling, mass and lump, trunk: Secondary | ICD-10-CM | POA: Diagnosis not present

## 2020-10-29 ENCOUNTER — Other Ambulatory Visit: Payer: Self-pay

## 2020-10-29 ENCOUNTER — Ambulatory Visit (INDEPENDENT_AMBULATORY_CARE_PROVIDER_SITE_OTHER): Payer: Medicare Other | Admitting: Sports Medicine

## 2020-10-29 DIAGNOSIS — S73191D Other sprain of right hip, subsequent encounter: Secondary | ICD-10-CM

## 2020-10-29 DIAGNOSIS — Z6833 Body mass index (BMI) 33.0-33.9, adult: Secondary | ICD-10-CM

## 2020-10-29 MED ORDER — TRAMADOL HCL 50 MG PO TABS: 50.0000 mg | ORAL_TABLET | Freq: Three times a day (TID) | ORAL | 0 refills | Status: DC | PRN

## 2020-10-29 NOTE — Assessment & Plan Note (Addendum)
This is a pleasant 60 year old female, we confirmed a hip labral tear with minimal osteoarthritis at the last visit with an arthrogram. She had good initial relief, she will continue with Tylenol, unable to use NSAIDs due to historical subarachnoid hemorrhage. Also would add tramadol for breakthrough pain and we can continue injections up to 3-4 times a year. I am happy to continue tramadol long-term, she will let me know how many pills she needs a day. We will hold off on any referral for hip replacement at this juncture. Considering her age and mild osteoarthritis she is not likely a candidate for arthroscopic labral repair.

## 2020-10-29 NOTE — Progress Notes (Signed)
    Procedures performed today:    None.  Independent interpretation of notes and tests performed by another provider:   None.  Brief History, Exam, Impression, and Recommendations:    Labral tear of right hip joint This is a pleasant 60 year old female, we confirmed a hip labral tear with minimal osteoarthritis at the last visit with an arthrogram. She had good initial relief, she will continue with Tylenol, unable to use NSAIDs due to historical subarachnoid hemorrhage. Also would add tramadol for breakthrough pain and we can continue injections up to 3-4 times a year. I am happy to continue tramadol long-term, she will let me know how many pills she needs a day. We will hold off on any referral for hip replacement at this juncture. Considering her age and mild osteoarthritis she is not likely a candidate for arthroscopic labral repair.  BMI 33.0-33.9,adult [place] would benefit from aggressive weight loss, she is interested in phentermine, she will discuss this with her PCP.    ___________________________________________ Gwen Her. Dianah Field, M.D., ABFM., CAQSM. Primary Care and [place] Instructor of [place] of [facility]

## 2020-10-29 NOTE — Assessment & Plan Note (Signed)
Jessica Lang would benefit from aggressive weight loss, she is interested in phentermine, she will discuss this with her PCP.

## 2020-10-30 ENCOUNTER — Other Ambulatory Visit: Payer: Self-pay | Admitting: General Surgery

## 2020-10-30 DIAGNOSIS — R2231 Localized swelling, mass and lump, right upper limb: Secondary | ICD-10-CM

## 2020-11-07 ENCOUNTER — Other Ambulatory Visit: Payer: Self-pay

## 2020-11-07 DIAGNOSIS — S73191D Other sprain of right hip, subsequent encounter: Secondary | ICD-10-CM

## 2020-11-07 MED ORDER — TRAMADOL HCL 50 MG PO TABS
100.0000 mg | ORAL_TABLET | Freq: Two times a day (BID) | ORAL | 0 refills | Status: DC | PRN
Start: 2020-11-07 — End: 2020-12-15

## 2020-11-07 NOTE — Telephone Encounter (Signed)
Per patient - "Dr T wanted to know how many I was taking each day to control my pain. I am getting the best relief from 2x twice a day ( 4 total daily ) Thank You".

## 2020-11-11 ENCOUNTER — Ambulatory Visit (INDEPENDENT_AMBULATORY_CARE_PROVIDER_SITE_OTHER): Payer: Medicare Other | Admitting: Osteopathic Medicine

## 2020-11-11 ENCOUNTER — Encounter: Payer: Self-pay | Admitting: Osteopathic Medicine

## 2020-11-11 ENCOUNTER — Other Ambulatory Visit: Payer: Self-pay

## 2020-11-11 VITALS — BP 129/76 | HR 52 | Temp 97.8°F | Wt 196.0 lb

## 2020-11-11 DIAGNOSIS — E669 Obesity, unspecified: Secondary | ICD-10-CM | POA: Diagnosis not present

## 2020-11-11 DIAGNOSIS — R001 Bradycardia, unspecified: Secondary | ICD-10-CM | POA: Diagnosis not present

## 2020-11-11 DIAGNOSIS — Z1211 Encounter for screening for malignant neoplasm of colon: Secondary | ICD-10-CM | POA: Diagnosis not present

## 2020-11-11 MED ORDER — PHENTERMINE HCL 37.5 MG PO TABS: 18.7500 mg | ORAL_TABLET | Freq: Every day | ORAL | 0 refills | Status: DC

## 2020-11-11 NOTE — Progress Notes (Signed)
Jessica Lang is a 60 y.o. female who presents to  [place] [facility]  today, 11/11/20, seeking care for the following:  . discuss weight loss prescriptions, goal to lose weight to help hip pain. See below . Would like second opinion re: symptomatic bradycardia and severe episodic fatigue  Wt Readings      11/11/20 First visit w/ me to discuss weight loss. Currently: 196 lb 0.6 oz (88.9 kg) Body mass index is 33.65 kg/m. --> started lower-dose Phentermine 18.75 mg daily  10/29/20 Visit w/ Dr T (sports med re: hip pain)- he discussed weight loss Rx w/ her and referred her to f/u w/ me   09/02/20 188 lb 11.2 oz (85.6 kg)  07/24/20 184 lb (83.5 kg)        ASSESSMENT & PLAN with other pertinent findings:  The primary encounter diagnosis was Obesity (BMI 30.0-34.9). A diagnosis of Bradycardia was also pertinent to this visit.   1. Obesity (BMI 30.0-34.9) See below  2. Bradycardia Doesn't sound like tachy-brady syndrome, previous visit w/ cardiology notes reviewed w/ Dr Geraldo Pitter 03/2020. No symptoms noted as of that visit but pt states she was having episodes of severe fatigue almost presyncopal    Patient Instructions  Weight loss prescriptions:  Phentermine for NO MORE than 3 months total.   At month 2-3, transition to long-term weight loss medication to continue at least 1-2 years. .  Long term medicines include:  Qsymia (Phentermine + Topiramate) - may help migraines/headaches Saxenda (Liraglutide daily injections) - will also help diabetes/prediabetes Wegovy (Semaglutide weekly injections) - will also help diabetes/prediabetes Contrave (Bupropion + Naltrexone) - may help mental health/depression  Orlistat (Xenical Rx, Alli OTC) - not my favorite, can cause diarrhea Bupropion (Wellbutrin) - generic antidepressant, will also help mental health, may help with quitting smoking  I recommend that you research the above  medications and see which one(s) your insurance may or may not cover: If you call your insurance, ask them specifically what medications are on their formulary that are approved for obesity treatment. They should be able to send you a list or tell you over the phone. Remember, medications aren't magic! You MUST be diligent about lifestyle changes as well!    Weight loss: important things to remember  Things to remember for exercise for weight loss:  Please note - I am not a certified personal trainer. I can present you with ideas and general workout goals, but an exercise program is largely up to you. Find something you can stick with, and something you enjoy!  Slow progression will help prevent injury! As you progress in your exercise regimen think about gradually increasing the following, week by week:  intensity (how strenuous is your workout?) frequency (how often are you exercising?) duration (how many minutes at a time are you exercising?) Walking for 20 minutes a day is certainly better than nothing, but more strenuous exercise will develop better cardiovascular fitness.  interval training (high-intensity alternating with low-intensity, think walk/jog rather than just walk) muscle strengthening exercises (weight lifting, calisthenics, yoga) - this also helps prevent osteoporosis!   Things to remember for diet changes for weight loss:  Please note - I am not a certified dietician. I can present you with ideas and general diet goals, but a meal plan is largely up to you. I am happy to refer you to a dietician who can give you a detailed meal plan. Apps/logs can be helpful to track how  you're eating! It's not realistic to be logging everything you eat forever, but when you're starting a healthy eating lifestyle it's very helpful, and checking in with logs now and then helps you stick to your program!  Calorie restriction / portion control with the goal weight loss of no more than one to one  and a half pounds per week.  Increase lean protein such as chicken, fish, [place].  Decrease fatty foods such as dairy, butter.  Decrease sugary foods and sweets. Avoid sugary drinks such as soda or juice. Increase fiber found in fruit and vegetables, whole grains.     Orders Placed This Encounter  Procedures  . Ambulatory referral to Cardiology    Meds ordered this encounter  Medications  . phentermine (ADIPEX-P) 37.5 MG tablet    Sig: Take 0.5 tablets (18.75 mg total) by mouth daily before breakfast.    Dispense:  15 tablet    Refill:  0     See below for relevant physical exam findings  See below for recent lab and imaging results reviewed  Medications, allergies, PMH, PSH, SocH, [place] reviewed below    Follow-up instructions: Return in about 4 weeks (around 12/09/2020) for IN-OFFICE VISIT w/ DR Sheppard Coil for MEDICAL WEIGHT MANAGEMENT .                                        Exam:  BP 129/76 (BP Location: Left Arm, Patient Position: Sitting, Cuff Size: Normal)   Pulse (!) 52   Temp 97.8 F (36.6 C) (Oral)   Wt 196 lb 0.6 oz (88.9 kg)   BMI 33.65 kg/m   Constitutional: VS see above. General Appearance: alert, well-developed, well-nourished, NAD  Neck: No masses, trachea midline.   Respiratory: Normal respiratory effort. no wheeze, no rhonchi, no rales  Cardiovascular: S1/S2 normal, no murmur, no rub/gallop auscultated. RRR.   Musculoskeletal: Gait normal.   Neurological: Normal balance/coordination. No tremor.  Skin: warm, dry, intact.   Psychiatric: Normal judgment/insight. Normal mood and affect. Oriented x3.   Current Meds  Medication Sig  . FLUoxetine (PROZAC) 20 MG capsule Take 1 capsule (20 mg total) by mouth daily.  . Magnesium 500 MG TABS Take 1,000 mg by mouth daily.   . OXcarbazepine (TRILEPTAL) 150 MG tablet Take 2 tablets (300 mg total) by mouth at bedtime.  . phentermine (ADIPEX-P) 37.5 MG tablet Take 0.5  tablets (18.75 mg total) by mouth daily before breakfast.  . POTASSIUM PO Take by mouth daily as needed.   . traMADol (ULTRAM) 50 MG tablet Take 2 tablets (100 mg total) by mouth every 12 (twelve) hours as needed for moderate pain.  Marland Kitchen VITAMIN D, CHOLECALCIFEROL, PO Take 5,000 Units by mouth daily.   . Zinc 50 MG CAPS Take by mouth daily.    Current Facility-Administered Medications for the 11/11/20 encounter (Office Visit) with Emeterio Reeve, DO  Medication  . promethazine (PHENERGAN) injection 25 mg    Allergies  Allergen Reactions  . Lithium Other (See Comments)    More aggressive    Patient Active Problem List   Diagnosis Date Noted  . Labral tear of right hip joint 09/11/2020  . Cutaneous lipomatosis 09/11/2020  . Diarrhea 07/24/2020  . Depression   . PTSD (post-traumatic stress disorder)   . At risk for obstructive sleep apnea 10/12/2018  . Subarachnoid hemorrhage ([place]) 09/01/2018  . Bradyarrhythmia 03/17/2018  . DOE (dyspnea  on exertion) 03/17/2018  . Chronic fatigue 02/21/2018  . COPD (chronic obstructive pulmonary disease) ([place]) 07/07/2016  . Chronic interstitial lung disease ([place]) 06/28/2016  . Acute bronchitis 06/28/2016  . COPD exacerbation ([place]) 06/28/2016  . Otitis, externa, infective 01/19/2016  . Tobacco abuse 09/11/2015  . History of posttraumatic stress disorder (PTSD) 09/11/2015  . S/P hysterectomy 09/11/2015  . Family history of bipolar disorder 09/11/2015  . Anxiety and depression 09/11/2015  . Family history of schizophrenia 09/11/2015  . BMI 33.0-33.9,adult 09/11/2015    Family History  Problem Relation Age of Onset  . Diabetes Mother   . Schizophrenia Mother   . Angina Father     Social History   Tobacco Use  Smoking Status Current Every Day Smoker  . Packs/day: 0.50  . Years: 40.00  . Pack years: 20.00  . Types: Cigarettes  . Start date: 09/26/2015  . Last attempt to quit: 10/10/2015  . Years since quitting: 5.0  Smokeless Tobacco  Never Used  Tobacco Comment   working on quitting    Past Surgical History:  Procedure Laterality Date  . ABDOMINAL HYSTERECTOMY    . CESAREAN SECTION     3  . CHOLECYSTECTOMY    . IR ANGIO INTRA EXTRACRAN SEL COM CAROTID INNOMINATE UNI R MOD SED  09/11/2018  . IR ANGIO INTRA EXTRACRAN SEL INTERNAL CAROTID UNI L MOD SED  09/11/2018  . IR ANGIO VERTEBRAL SEL VERTEBRAL UNI R MOD SED  09/11/2018  . IR US GUIDE VASC ACCESS RIGHT  09/11/2018  . OOPHORECTOMY     one ovary removed    Immunization History  Administered Date(s) Administered  . PFIZER(Purple Top)SARS-COV-2 Vaccination 05/08/2020, 05/29/2020  . Tdap 09/16/2015    No results found for this or any previous visit (from the past 2160 hour(s)).  No results found.     All questions at time of visit were answered - patient instructed to contact office with any additional concerns or updates. ER/RTC precautions were reviewed with the patient as applicable.   Please note: manual typing as well as voice recognition software may have been used to produce this document - typos may escape review. Please contact Dr. Sheppard Coil for any needed clarifications.

## 2020-11-11 NOTE — Patient Instructions (Addendum)
Weight loss prescriptions:  Phentermine for NO MORE than 3 months total.   At month 2-3, transition to long-term weight loss medication to continue at least 1-2 years. .  Long term medicines include:  Qsymia (Phentermine + Topiramate) - may help migraines/headaches Saxenda (Liraglutide daily injections) - will also help diabetes/prediabetes Wegovy (Semaglutide weekly injections) - will also help diabetes/prediabetes Contrave (Bupropion + Naltrexone) - may help mental health/depression  Orlistat (Xenical Rx, Alli OTC) - not my favorite, can cause diarrhea Bupropion (Wellbutrin) - generic antidepressant, will also help mental health, may help with quitting smoking  I recommend that you research the above medications and see which one(s) your insurance may or may not cover: If you call your insurance, ask them specifically what medications are on their formulary that are approved for obesity treatment. They should be able to send you a list or tell you over the phone. Remember, medications aren't magic! You MUST be diligent about lifestyle changes as well!    Weight loss: important things to remember  Things to remember for exercise for weight loss:  Please note - I am not a certified personal trainer. I can present you with ideas and general workout goals, but an exercise program is largely up to you. Find something you can stick with, and something you enjoy!  Slow progression will help prevent injury! As you progress in your exercise regimen think about gradually increasing the following, week by week:  intensity (how strenuous is your workout?) frequency (how often are you exercising?) duration (how many minutes at a time are you exercising?) Walking for 20 minutes a day is certainly better than nothing, but more strenuous exercise will develop better cardiovascular fitness.  interval training (high-intensity alternating with low-intensity, think walk/jog rather than just walk) muscle  strengthening exercises (weight lifting, calisthenics, yoga) - this also helps prevent osteoporosis!   Things to remember for diet changes for weight loss:  Please note - I am not a certified dietician. I can present you with ideas and general diet goals, but a meal plan is largely up to you. I am happy to refer you to a dietician who can give you a detailed meal plan. Apps/logs can be helpful to track how you're eating! It's not realistic to be logging everything you eat forever, but when you're starting a healthy eating lifestyle it's very helpful, and checking in with logs now and then helps you stick to your program!  Calorie restriction / portion control with the goal weight loss of no more than one to one and a half pounds per week.  Increase lean protein such as chicken, fish, [place].  Decrease fatty foods such as dairy, butter.  Decrease sugary foods and sweets. Avoid sugary drinks such as soda or juice. Increase fiber found in fruit and vegetables, whole grains.

## 2020-11-20 LAB — COLOGUARD: COLOGUARD: NEGATIVE

## 2020-11-22 ENCOUNTER — Ambulatory Visit
Admission: RE | Admit: 2020-11-22 | Discharge: 2020-11-22 | Disposition: A | Discharge status: Home / Self Care | Payer: Medicare Other | Source: Ambulatory Visit | Attending: General Surgery | Admitting: General Surgery

## 2020-11-22 ENCOUNTER — Other Ambulatory Visit: Payer: Self-pay

## 2020-11-22 DIAGNOSIS — R2231 Localized swelling, mass and lump, right upper limb: Secondary | ICD-10-CM

## 2020-11-22 DIAGNOSIS — D1779 Benign lipomatous neoplasm of other sites: Secondary | ICD-10-CM | POA: Diagnosis not present

## 2020-11-22 MED ORDER — GADOBENATE DIMEGLUMINE 529 MG/ML IV SOLN
17.0000 mL | Freq: Once | INTRAVENOUS | Status: AC | PRN
  Administered 2020-11-22: 17 mL via INTRAVENOUS

## 2020-12-02 LAB — COLOGUARD: Cologuard: NEGATIVE

## 2020-12-09 ENCOUNTER — Other Ambulatory Visit: Payer: Self-pay

## 2020-12-09 ENCOUNTER — Ambulatory Visit (INDEPENDENT_AMBULATORY_CARE_PROVIDER_SITE_OTHER): Payer: Medicare Other | Admitting: Osteopathic Medicine

## 2020-12-09 ENCOUNTER — Encounter: Payer: Self-pay | Admitting: Osteopathic Medicine

## 2020-12-09 ENCOUNTER — Telehealth: Payer: Self-pay | Admitting: Osteopathic Medicine

## 2020-12-09 VITALS — BP 138/72 | HR 61 | Temp 98.3°F | Wt 188.1 lb

## 2020-12-09 DIAGNOSIS — E669 Obesity, unspecified: Secondary | ICD-10-CM | POA: Diagnosis not present

## 2020-12-09 MED ORDER — PHENTERMINE HCL 37.5 MG PO TABS: 37.5000 mg | ORAL_TABLET | Freq: Every day | ORAL | 0 refills | Status: DC

## 2020-12-09 MED ORDER — PHENTERMINE HCL 37.5 MG PO TABS: 18.7500 mg | ORAL_TABLET | Freq: Every day | ORAL | 0 refills | Status: DC

## 2020-12-09 NOTE — Telephone Encounter (Signed)
Patient was back In office after appt and said wrong dosage of medication was sent in, she needs the higher does of PHENTERMINE sent in instead of the 18.75.

## 2020-12-09 NOTE — Progress Notes (Signed)
Jessica Lang is a 60 y.o. female who presents to  [place] [facility]  today, 12/09/20, seeking care for the following:  . Weight management, doing well on phentermine, weigh tloss as below, requests full dose Rx since no side effects  Wt Readings   12/11/20 today Weight 188 lb (-8 lb approx 4% BW), Body mass index is 32.29 kg/m. BP 138/72 --> refilled phentermine at 37.5 mg daily   11/11/20 First visit w/ me to discuss weight loss. Currently: 196 lb 0.6 oz (88.9 kg) Body mass index is 33.65 kg/m. --> started lower-dose Phentermine 18.75 mg daily, given list of Rx to contact insurance about coverage for long-term Rx   10/29/20 Visit w/ Dr T (sports med re: hip pain)- he discussed weight loss Rx w/ her and referred her to f/u w/ me   09/02/20 188 lb 11.2 oz (85.6 kg)  07/24/20 184 lb (83.5 kg)      ASSESSMENT & PLAN with other pertinent findings:  The encounter diagnosis was Obesity (BMI 30.0-34.9).    Patient Instructions   I'll look into possible interactions w/ Topamax and Trileptal   If any issue, would go w/ the injectable weight loss Rx (Saxenda daily or Wegovy weekly)   May still want to discuss w/ cardiology regarding heart rate  Stay on phentermine for 1-2 more months    No orders of the defined types were placed in this encounter.      See below for relevant physical exam findings  See below for recent lab and imaging results reviewed  Medications, allergies, PMH, PSH, SocH, [place] reviewed below    Follow-up instructions: Return in about 4 weeks (around 01/06/2021) for  in office visit medical weight management - see me sooner if needed .                                        Exam:  BP 138/72 (BP Location: Left Arm, Patient Position: Sitting, Cuff Size: Normal)   Pulse 61   Temp 98.3 F (36.8 C) (Oral)   Wt 188 lb 1.9 oz (85.3 kg)   BMI 32.29 kg/m    Constitutional: VS see above. General Appearance: alert, well-developed, well-nourished, NAD  Neck: No masses, trachea midline.   Respiratory: Normal respiratory effort. no wheeze, no rhonchi, no rales  Cardiovascular: S1/S2 normal, no murmur, no rub/gallop auscultated. RRR.   Musculoskeletal: Gait normal. Symmetric and independent movement of all extremities  Neurological: Normal balance/coordination. No tremor.  Skin: warm, dry, intact.   Psychiatric: Normal judgment/insight. Normal mood and affect. Oriented x3.   Current Meds  Medication Sig  . FLUoxetine (PROZAC) 20 MG capsule Take 1 capsule (20 mg total) by mouth daily.  . Magnesium 500 MG TABS Take 1,000 mg by mouth daily.   . OXcarbazepine (TRILEPTAL) 150 MG tablet Take 2 tablets (300 mg total) by mouth at bedtime.  Marland Kitchen POTASSIUM PO Take by mouth daily as needed.   . traMADol (ULTRAM) 50 MG tablet Take 2 tablets (100 mg total) by mouth every 12 (twelve) hours as needed for moderate pain.  Marland Kitchen VITAMIN D, CHOLECALCIFEROL, PO Take 5,000 Units by mouth daily.   . Zinc 50 MG CAPS Take by mouth daily.   . [DISCONTINUED] phentermine (ADIPEX-P) 37.5 MG tablet Take 0.5 tablets (18.75 mg total) by mouth daily before breakfast.  . [DISCONTINUED] phentermine (ADIPEX-P) 37.5  MG tablet Take 0.5 tablets (18.75 mg total) by mouth daily before breakfast.   Current Facility-Administered Medications for the 12/09/20 encounter (Office Visit) with Emeterio Reeve, DO  Medication  . promethazine (PHENERGAN) injection 25 mg    Allergies  Allergen Reactions  . Lithium Other (See Comments)    More aggressive    Patient Active Problem List   Diagnosis Date Noted  . Labral tear of right hip joint 09/11/2020  . Cutaneous lipomatosis 09/11/2020  . Diarrhea 07/24/2020  . Depression   . PTSD (post-traumatic stress disorder)   . At risk for obstructive sleep apnea 10/12/2018  . Subarachnoid hemorrhage ([place]) 09/01/2018  . Bradyarrhythmia  03/17/2018  . DOE (dyspnea on exertion) 03/17/2018  . Chronic fatigue 02/21/2018  . COPD (chronic obstructive pulmonary disease) ([place]) 07/07/2016  . Chronic interstitial lung disease ([place]) 06/28/2016  . Acute bronchitis 06/28/2016  . COPD exacerbation ([place]) 06/28/2016  . Otitis, externa, infective 01/19/2016  . Tobacco abuse 09/11/2015  . History of posttraumatic stress disorder (PTSD) 09/11/2015  . S/P hysterectomy 09/11/2015  . Family history of bipolar disorder 09/11/2015  . Anxiety and depression 09/11/2015  . Family history of schizophrenia 09/11/2015  . BMI 33.0-33.9,adult 09/11/2015    Family History  Problem Relation Age of Onset  . Diabetes Mother   . Schizophrenia Mother   . Angina Father     Social History   Tobacco Use  Smoking Status Current Every Day Smoker  . Packs/day: 0.50  . Years: 40.00  . Pack years: 20.00  . Types: Cigarettes  . Start date: 09/26/2015  . Last attempt to quit: 10/10/2015  . Years since quitting: 5.1  Smokeless Tobacco Never Used  Tobacco Comment   working on quitting    Past Surgical History:  Procedure Laterality Date  . ABDOMINAL HYSTERECTOMY    . CESAREAN SECTION     3  . CHOLECYSTECTOMY    . IR ANGIO INTRA EXTRACRAN SEL COM CAROTID INNOMINATE UNI R MOD SED  09/11/2018  . IR ANGIO INTRA EXTRACRAN SEL INTERNAL CAROTID UNI L MOD SED  09/11/2018  . IR ANGIO VERTEBRAL SEL VERTEBRAL UNI R MOD SED  09/11/2018  . IR US GUIDE VASC ACCESS RIGHT  09/11/2018  . OOPHORECTOMY     one ovary removed    Immunization History  Administered Date(s) Administered  . PFIZER(Purple Top)SARS-COV-2 Vaccination 05/08/2020, 05/29/2020  . Tdap 09/16/2015    Recent Results (from the past 2160 hour(s))  Cologuard     Status: None   Collection Time: 11/11/20 12:00 AM  Result Value Ref Range   Cologuard Negative Negative    No results found.     All questions at time of visit were answered - patient instructed to contact office with any  additional concerns or updates. ER/RTC precautions were reviewed with the patient as applicable.   Please note: manual typing as well as voice recognition software may have been used to produce this document - typos may escape review. Please contact Dr. Sheppard Coil for any needed clarifications.

## 2020-12-09 NOTE — Telephone Encounter (Signed)
Routing to provider to update the rx.

## 2020-12-09 NOTE — Patient Instructions (Signed)
   I'll look into possible interactions w/ Topamax and Trileptal   If any issue, would go w/ the injectable weight loss Rx (Saxenda daily or Wegovy weekly)   May still want to discuss w/ cardiology regarding heart rate  Stay on phentermine for 1-2 more months

## 2020-12-11 ENCOUNTER — Encounter: Payer: Self-pay | Admitting: Osteopathic Medicine

## 2020-12-15 ENCOUNTER — Other Ambulatory Visit: Payer: Self-pay

## 2020-12-15 DIAGNOSIS — S73191D Other sprain of right hip, subsequent encounter: Secondary | ICD-10-CM

## 2020-12-16 MED ORDER — TRAMADOL HCL 50 MG PO TABS: 100.0000 mg | ORAL_TABLET | Freq: Two times a day (BID) | ORAL | 0 refills | Status: DC | PRN
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
## 2021-01-07 ENCOUNTER — Ambulatory Visit (INDEPENDENT_AMBULATORY_CARE_PROVIDER_SITE_OTHER): Payer: Medicare Other | Admitting: Osteopathic Medicine

## 2021-01-07 ENCOUNTER — Encounter: Payer: Self-pay | Admitting: Osteopathic Medicine

## 2021-01-07 ENCOUNTER — Other Ambulatory Visit: Payer: Self-pay

## 2021-01-07 VITALS — BP 116/64 | HR 67 | Temp 98.4°F | Wt 186.1 lb

## 2021-01-07 DIAGNOSIS — R635 Abnormal weight gain: Secondary | ICD-10-CM | POA: Diagnosis not present

## 2021-01-07 MED ORDER — PHENTERMINE HCL 37.5 MG PO TABS: 37.5000 mg | ORAL_TABLET | Freq: Every day | ORAL | 0 refills | Status: DC

## 2021-01-07 NOTE — Progress Notes (Signed)
Jessica Lang is a 60 y.o. female who presents to  [place] at [facility]  today, 01/07/21, seeking care for the following:  . Weight management, doing well on phentermine, weigh tloss as below, requests full dose Rx since no side effects  Wt Readings   01/07/21 Weight: 186 lb (-10 lb = approx 6% BW), Body mass index is 31.95 kg/m. --> refill phentermine, advised come off this 2-3 days prior to cardiology appointment, plan to transition to Qsymia next 1-2 mos    12/09/20 Weight 188 lb (-8 lb = approx 4% BW), Body mass index is 32.29 kg/m. BP 138/72 --> refilled phentermine at 37.5 mg daily   11/11/20 First visit w/ me to discuss weight loss. Currently: 196 lb 0.6 oz (88.9 kg) Body mass index is 33.65 kg/m. --> started lower-dose Phentermine 18.75 mg daily, given list of Rx to contact insurance about coverage for long-term Rx   10/29/20 Visit w/ Dr T (sports med re: hip pain)- he discussed weight loss Rx w/ her and referred her to f/u w/ me   09/02/20 188 lb 11.2 oz (85.6 kg)  07/24/20 184 lb (83.5 kg)      ASSESSMENT & PLAN with other pertinent findings:  The encounter diagnosis was Abnormal weight gain.   Weight: 186 lb (-10 lb = approx 6% BW), Body mass index is 31.95 kg/m. --> refill phentermine, advised come off this 2-3 days prior to cardiology appointment, plan to transition to Qsymia next 1-2 mos    There are no Patient Instructions on file for this visit.  No orders of the defined types were placed in this encounter.      See below for relevant physical exam findings  See below for recent lab and imaging results reviewed  Medications, allergies, PMH, PSH, SocH, [place] reviewed below    Follow-up instructions: Return in about 4 weeks (around 02/04/2021) for IN-OFFICE VISIT MEDICAL WEIGHT MANAGEMENT .                                        Exam:  BP 116/64 (BP Location: Left Arm,  Patient Position: Sitting, Cuff Size: Normal)   Pulse 67   Temp 98.4 F (36.9 C) (Oral)   Wt 186 lb 1.9 oz (84.4 kg)   BMI 31.95 kg/m   Constitutional: VS see above. General Appearance: alert, well-developed, well-nourished, NAD  Neck: No masses, trachea midline.   Respiratory: Normal respiratory effort.  Neurological: Normal balance/coordination. No tremor.  Skin: warm, dry, intact.   Psychiatric: Normal judgment/insight. Normal mood and affect. Oriented x3.   Current Meds  Medication Sig  . FLUoxetine (PROZAC) 20 MG capsule Take 1 capsule (20 mg total) by mouth daily.  . Magnesium 500 MG TABS Take 1,000 mg by mouth daily.   . OXcarbazepine (TRILEPTAL) 150 MG tablet Take 2 tablets (300 mg total) by mouth at bedtime.  Marland Kitchen POTASSIUM PO Take by mouth daily as needed.   . traMADol (ULTRAM) 50 MG tablet Take 2 tablets (100 mg total) by mouth every 12 (twelve) hours as needed for moderate pain.  Marland Kitchen VITAMIN D, CHOLECALCIFEROL, PO Take 5,000 Units by mouth daily.   . Zinc 50 MG CAPS Take by mouth daily.   . [DISCONTINUED] phentermine (ADIPEX-P) 37.5 MG tablet Take 1 tablet (37.5 mg total) by mouth daily before breakfast.   Current Facility-Administered Medications for the  01/07/21 encounter (Office Visit) with Emeterio Reeve, DO  Medication  . promethazine (PHENERGAN) injection 25 mg    Allergies  Allergen Reactions  . Lithium Other (See Comments)    More aggressive    Patient Active Problem List   Diagnosis Date Noted  . Labral tear of right hip joint 09/11/2020  . Cutaneous lipomatosis 09/11/2020  . Diarrhea 07/24/2020  . Depression   . PTSD (post-traumatic stress disorder)   . At risk for obstructive sleep apnea 10/12/2018  . Subarachnoid hemorrhage ([place]) 09/01/2018  . Bradyarrhythmia 03/17/2018  . DOE (dyspnea on exertion) 03/17/2018  . Chronic fatigue 02/21/2018  . COPD (chronic obstructive pulmonary disease) ([place]) 07/07/2016  . Chronic interstitial lung disease  ([place]) 06/28/2016  . Acute bronchitis 06/28/2016  . COPD exacerbation ([place]) 06/28/2016  . Otitis, externa, infective 01/19/2016  . Tobacco abuse 09/11/2015  . History of posttraumatic stress disorder (PTSD) 09/11/2015  . S/P hysterectomy 09/11/2015  . Family history of bipolar disorder 09/11/2015  . Anxiety and depression 09/11/2015  . Family history of schizophrenia 09/11/2015  . BMI 33.0-33.9,adult 09/11/2015    Family History  Problem Relation Age of Onset  . Diabetes Mother   . Schizophrenia Mother   . Angina Father     Social History   Tobacco Use  Smoking Status Current Every Day Smoker  . Packs/day: 0.50  . Years: 40.00  . Pack years: 20.00  . Types: Cigarettes  . Start date: 09/26/2015  . Last attempt to quit: 10/10/2015  . Years since quitting: 5.2  Smokeless Tobacco Never Used  Tobacco Comment   working on quitting    Past Surgical History:  Procedure Laterality Date  . ABDOMINAL HYSTERECTOMY    . CESAREAN SECTION     3  . CHOLECYSTECTOMY    . IR ANGIO INTRA EXTRACRAN SEL COM CAROTID INNOMINATE UNI R MOD SED  09/11/2018  . IR ANGIO INTRA EXTRACRAN SEL INTERNAL CAROTID UNI L MOD SED  09/11/2018  . IR ANGIO VERTEBRAL SEL VERTEBRAL UNI R MOD SED  09/11/2018  . IR US GUIDE VASC ACCESS RIGHT  09/11/2018  . OOPHORECTOMY     one ovary removed    Immunization History  Administered Date(s) Administered  . PFIZER(Purple Top)SARS-COV-2 Vaccination 05/08/2020, 05/29/2020  . Tdap 09/16/2015    Recent Results (from the past 2160 hour(s))  Cologuard     Status: None   Collection Time: 11/11/20 12:00 AM  Result Value Ref Range   Cologuard Negative Negative    No results found.     All questions at time of visit were answered - patient instructed to contact office with any additional concerns or updates. ER/RTC precautions were reviewed with the patient as applicable.   Please note: manual typing as well as voice recognition software may have been used to  produce this document - typos may escape review. Please contact Dr. Sheppard Coil for any needed clarifications.

## 2021-01-14 ENCOUNTER — Other Ambulatory Visit: Payer: Self-pay

## 2021-01-14 ENCOUNTER — Ambulatory Visit (INDEPENDENT_AMBULATORY_CARE_PROVIDER_SITE_OTHER): Payer: Medicare Other | Admitting: Cardiology

## 2021-01-14 ENCOUNTER — Encounter: Payer: Self-pay | Admitting: Cardiology

## 2021-01-14 VITALS — BP 141/83 | HR 63 | Ht 64.0 in | Wt 188.4 lb

## 2021-01-14 DIAGNOSIS — I498 Other specified cardiac arrhythmias: Secondary | ICD-10-CM

## 2021-01-14 DIAGNOSIS — R002 Palpitations: Secondary | ICD-10-CM

## 2021-01-14 NOTE — Patient Instructions (Signed)

## 2021-01-26 ENCOUNTER — Other Ambulatory Visit: Payer: Self-pay

## 2021-01-26 DIAGNOSIS — S73191D Other sprain of right hip, subsequent encounter: Secondary | ICD-10-CM

## 2021-01-26 MED ORDER — TRAMADOL HCL 50 MG PO TABS: 100.0000 mg | ORAL_TABLET | Freq: Two times a day (BID) | ORAL | 0 refills | Status: DC | PRN

## 2021-02-11 ENCOUNTER — Encounter: Payer: Self-pay | Admitting: Osteopathic Medicine

## 2021-02-11 ENCOUNTER — Ambulatory Visit (INDEPENDENT_AMBULATORY_CARE_PROVIDER_SITE_OTHER): Payer: Medicare Other | Admitting: Osteopathic Medicine

## 2021-02-11 ENCOUNTER — Other Ambulatory Visit: Payer: Self-pay

## 2021-02-11 VITALS — BP 118/73 | HR 73 | Temp 98.5°F | Wt 177.0 lb

## 2021-02-11 DIAGNOSIS — E669 Obesity, unspecified: Secondary | ICD-10-CM | POA: Diagnosis not present

## 2021-02-11 DIAGNOSIS — R635 Abnormal weight gain: Secondary | ICD-10-CM

## 2021-02-11 DIAGNOSIS — R001 Bradycardia, unspecified: Secondary | ICD-10-CM

## 2021-02-11 MED ORDER — PHENTERMINE HCL 37.5 MG PO TABS: 37.5000 mg | ORAL_TABLET | Freq: Every day | ORAL | 0 refills | Status: DC

## 2021-02-11 NOTE — Progress Notes (Signed)
Jessica Lang is a 60 y.o. female who presents to  [place] at [facility]  today, 01/07/21, seeking care for the following:  . Weight management, doing well on phentermine, weigh tloss as below, requests full dose Rx since no side effects  Wt Readings   02/11/21  Weight: 177 lb (-19 lb = approx 9% BW from initial), Body mass index is 30.39 kg/m.  Reviewed cardiology notes from recent visit,  --> refill phentermine, can do one more month or can transition to hopefully Qsymia   01/07/21 Weight: 186 lb (-10 lb = approx 6% BW), Body mass index is 31.95 kg/m. --> refill phentermine, advised come off this 2-3 days prior to cardiology appointment, plan to transition to Qsymia next 1-2 mos    12/09/20 Weight 188 lb (-8 lb = approx 4% BW), Body mass index is 32.29 kg/m. BP 138/72 --> refilled phentermine at 37.5 mg daily   11/11/20 First visit w/ me to discuss weight loss. Currently: 196 lb 0.6 oz (88.9 kg) Body mass index is 33.65 kg/m. --> started lower-dose Phentermine 18.75 mg daily, given list of Rx to contact insurance about coverage for long-term Rx   10/29/20 Visit w/ Dr T (sports med re: hip pain)- he discussed weight loss Rx w/ her and referred her to f/u w/ me   09/02/20 188 lb 11.2 oz (85.6 kg)  07/24/20 184 lb (83.5 kg)      ASSESSMENT & PLAN with other pertinent findings:  The primary encounter diagnosis was Abnormal weight gain. Diagnoses of Obesity (BMI 30.0-34.9) and Bradycardia were also pertinent to this visit.   Weight: 186 lb (-10 lb = approx 6% BW), Body mass index is 31.95 kg/m. --> refill phentermine, advised come off this 2-3 days prior to cardiology appointment, plan to transition to Qsymia next 1-2 mos    Patient Instructions   Will refill Phentermine 37.5 mg daily  This Rx will be 3 mos on higher dose  May consider fourth month depending how you're doing next visit OR...  After that, will plan to transition  to long-term Rx  If covered, ideally would prescribe Qsymia (combination pill of low-dose phentermine + topiramate)  If not covered, we can continue low-dose phentermine + topiramate      No orders of the defined types were placed in this encounter.      See below for relevant physical exam findings  See below for recent lab and imaging results reviewed  Medications, allergies, PMH, PSH, SocH, FamH reviewed below    Follow-up instructions: Return in about 4 weeks (around 03/11/2021) for medical weight management - refill phetnermine vs transition to Qsymia .                                        Exam:  BP 118/73 (BP Location: Left Arm, Patient Position: Sitting, Cuff Size: Normal)   Pulse 73   Temp 98.5 F (36.9 C) (Oral)   Wt 177 lb 0.6 oz (80.3 kg)   BMI 30.39 kg/m   Constitutional: VS see above. General Appearance: alert, well-developed, well-nourished, NAD  Neck: No masses, trachea midline.   Respiratory: Normal respiratory effort.  Neurological: Normal balance/coordination. No tremor.  Skin: warm, dry, intact.   Psychiatric: Normal judgment/insight. Normal mood and affect. Oriented x3.   No outpatient medications have been marked as taking for the 02/11/21 encounter (Office  Visit) with Emeterio Reeve, DO.   Current Facility-Administered Medications for the 02/11/21 encounter (Office Visit) with Emeterio Reeve, DO  Medication  . promethazine (PHENERGAN) injection 25 mg    Allergies  Allergen Reactions  . Lithium Other (See Comments)    More aggressive    Patient Active Problem List   Diagnosis Date Noted  . Labral tear of right hip joint 09/11/2020  . Cutaneous lipomatosis 09/11/2020  . Diarrhea 07/24/2020  . Depression   . PTSD (post-traumatic stress disorder)   . At risk for obstructive sleep apnea 10/12/2018  . Subarachnoid hemorrhage ([place]) 09/01/2018  . Bradyarrhythmia 03/17/2018  . DOE (dyspnea on  exertion) 03/17/2018  . Chronic fatigue 02/21/2018  . COPD (chronic obstructive pulmonary disease) ([place]) 07/07/2016  . Chronic interstitial lung disease ([place]) 06/28/2016  . Acute bronchitis 06/28/2016  . COPD exacerbation ([place]) 06/28/2016  . Otitis, externa, infective 01/19/2016  . Tobacco abuse 09/11/2015  . History of posttraumatic stress disorder (PTSD) 09/11/2015  . S/P hysterectomy 09/11/2015  . Family history of bipolar disorder 09/11/2015  . Anxiety and depression 09/11/2015  . Family history of schizophrenia 09/11/2015  . BMI 33.0-33.9,adult 09/11/2015    Family History  Problem Relation Age of Onset  . Diabetes Mother   . Schizophrenia Mother   . Angina Father     Social History   Tobacco Use  Smoking Status Former Smoker  . Packs/day: 0.50  . Years: 40.00  . Pack years: 20.00  . Types: Cigarettes  . Start date: 09/26/2015  . Quit date: 07/14/2020  . Years since quitting: 0.5  Smokeless Tobacco Never Used  Tobacco Comment   working on quitting    Past Surgical History:  Procedure Laterality Date  . ABDOMINAL HYSTERECTOMY    . CESAREAN SECTION     3  . CHOLECYSTECTOMY    . IR ANGIO INTRA EXTRACRAN SEL COM CAROTID INNOMINATE UNI R MOD SED  09/11/2018  . IR ANGIO INTRA EXTRACRAN SEL INTERNAL CAROTID UNI L MOD SED  09/11/2018  . IR ANGIO VERTEBRAL SEL VERTEBRAL UNI R MOD SED  09/11/2018  . IR US GUIDE VASC ACCESS RIGHT  09/11/2018  . OOPHORECTOMY     one ovary removed    Immunization History  Administered Date(s) Administered  . PFIZER(Purple Top)SARS-COV-2 Vaccination 05/08/2020, 05/29/2020  . Tdap 09/16/2015    No results found for this or any previous visit (from the past 2160 hour(s)).  No results found.     All questions at time of visit were answered - patient instructed to contact office with any additional concerns or updates. ER/RTC precautions were reviewed with the patient as applicable.   Please note: manual typing as well as voice  recognition software may have been used to produce this document - typos may escape review. Please contact Dr. Sheppard Coil for any needed clarifications.

## 2021-02-11 NOTE — Patient Instructions (Addendum)
   Will refill Phentermine 37.5 mg daily  This Rx will be 3 mos on higher dose  May consider fourth month depending how you're doing next visit OR...  After that, will plan to transition to long-term Rx  If covered, ideally would prescribe Qsymia (combination pill of low-dose phentermine + topiramate)  If not covered, we can continue low-dose phentermine + topiramate

## 2021-03-09 ENCOUNTER — Other Ambulatory Visit: Payer: Self-pay

## 2021-03-09 DIAGNOSIS — S73191D Other sprain of right hip, subsequent encounter: Secondary | ICD-10-CM

## 2021-03-09 IMAGING — US US EXTREM UP *R* LTD
1 series · 8 of 8 positions shown · non-contrast
Comparison: None.

CLINICAL DATA: Nonpainful palpable lump in the right shoulder
region for 2 months

EXAM:
ULTRASOUND RIGHT UPPER EXTREMITY LIMITED
TECHNIQUE: Ultrasound examination of the upper extremity soft tissues was
performed in the area of clinical concern.

[Series 1: us extrem up *right* ltd · 0.07mm/px · 8 acquisitions, 8 frames shown]
[im 1/8]
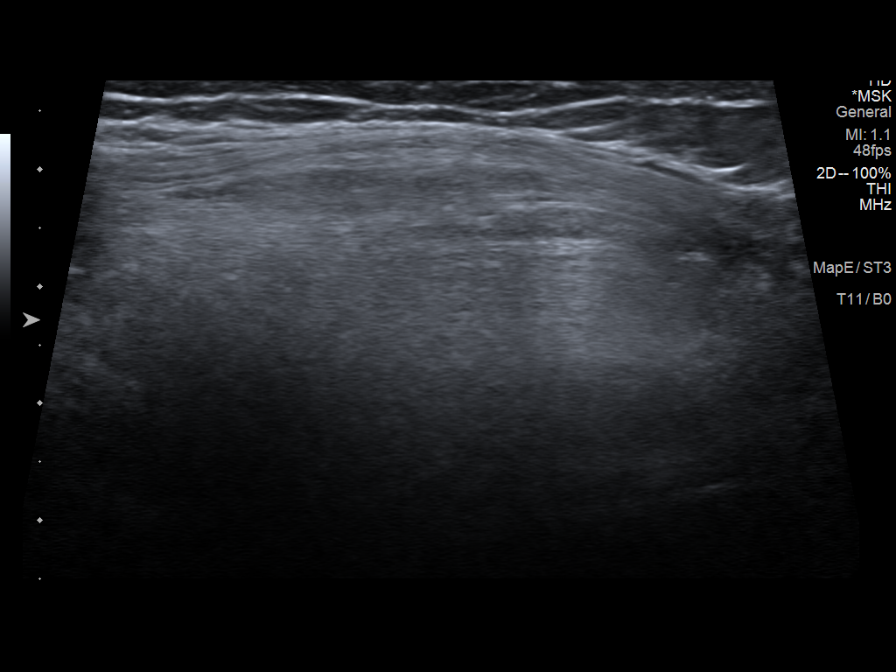
[im 2/8]
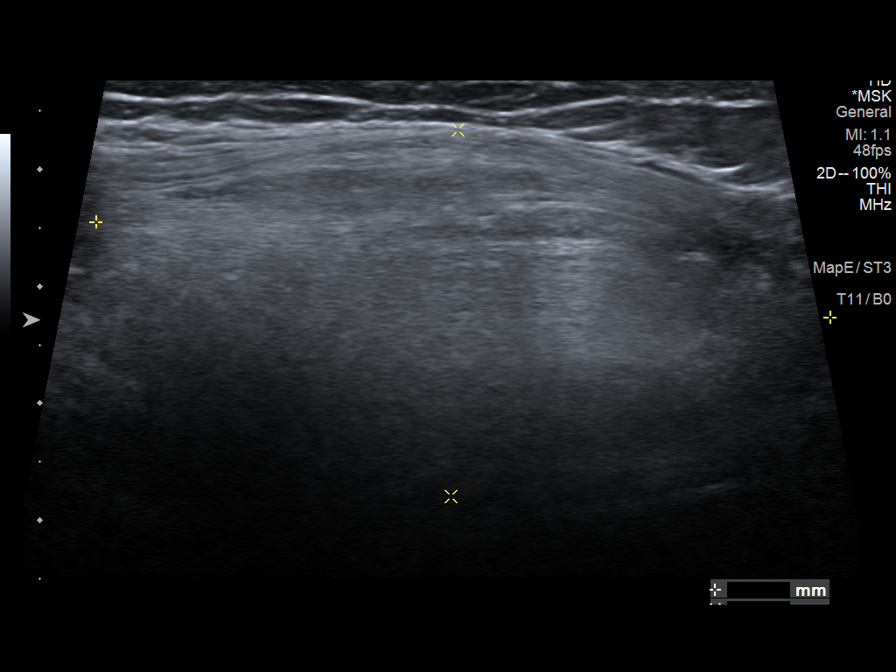
[im 3/8]
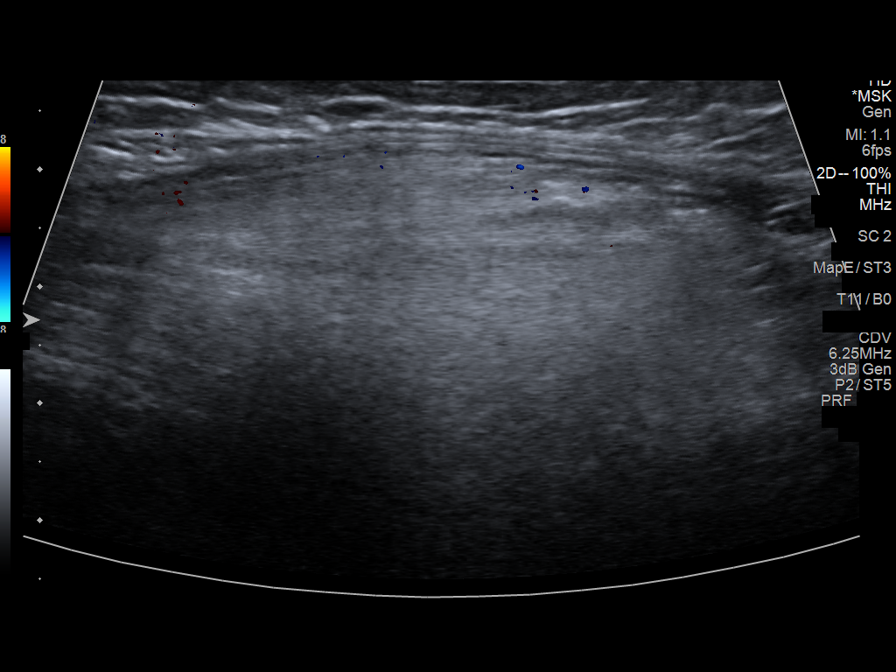
[im 4/8]
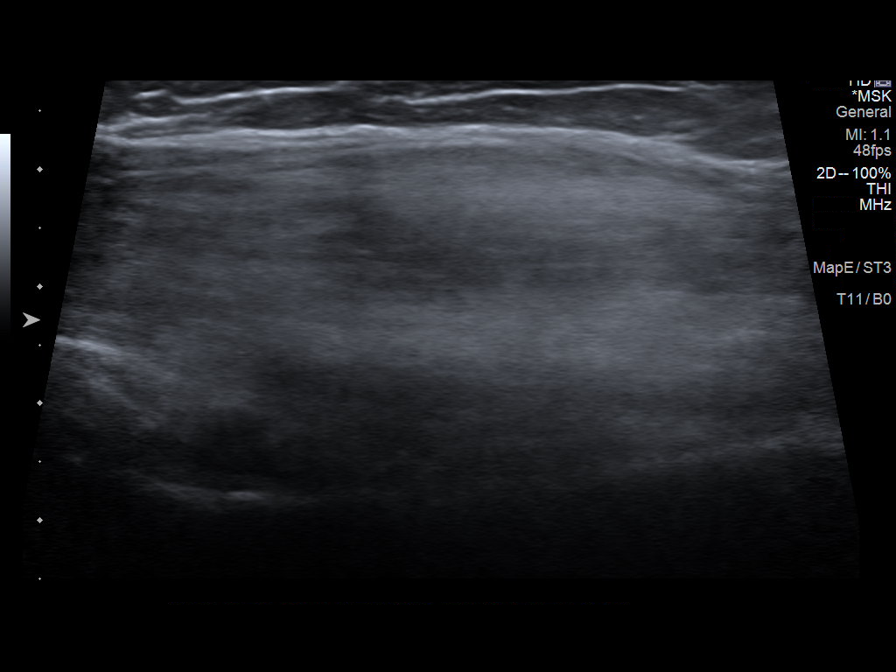
[im 5/8]
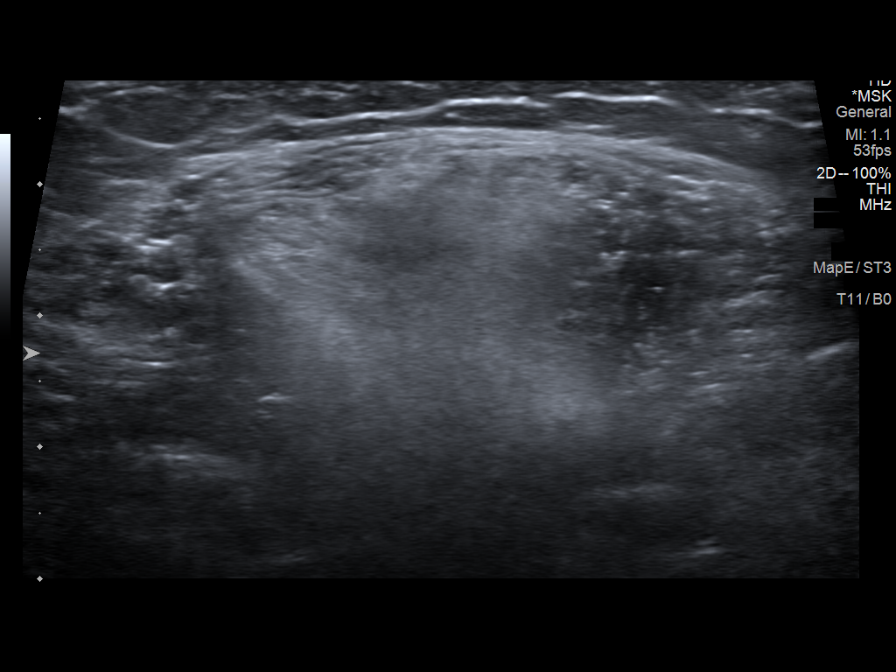
[im 6/8]
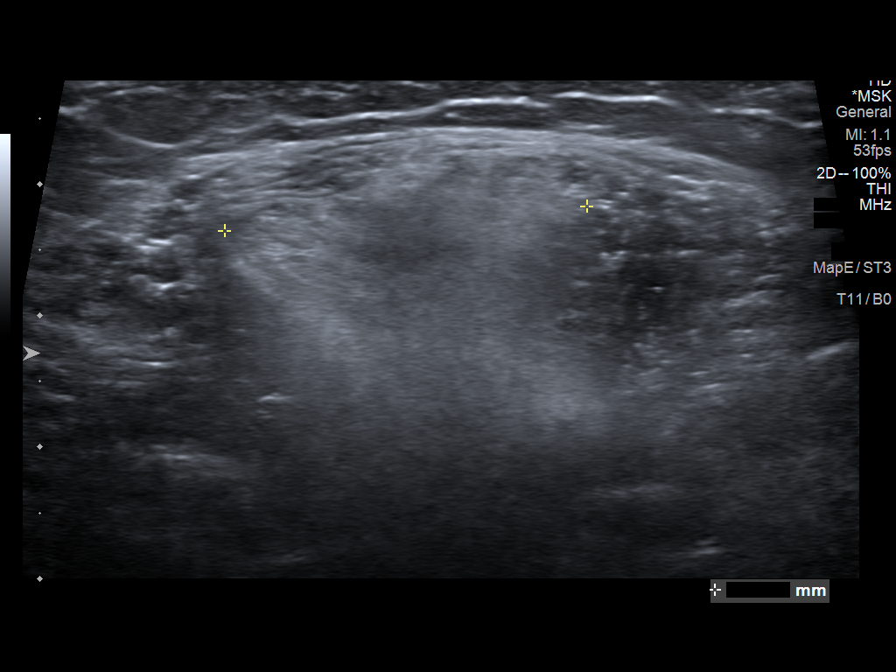
[im 7/8]
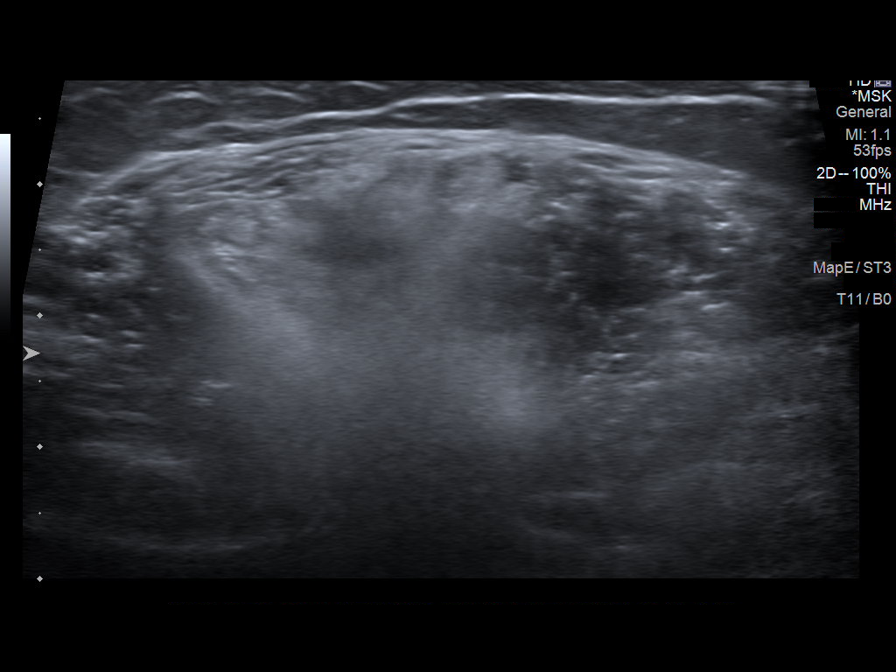
[im 8/8]
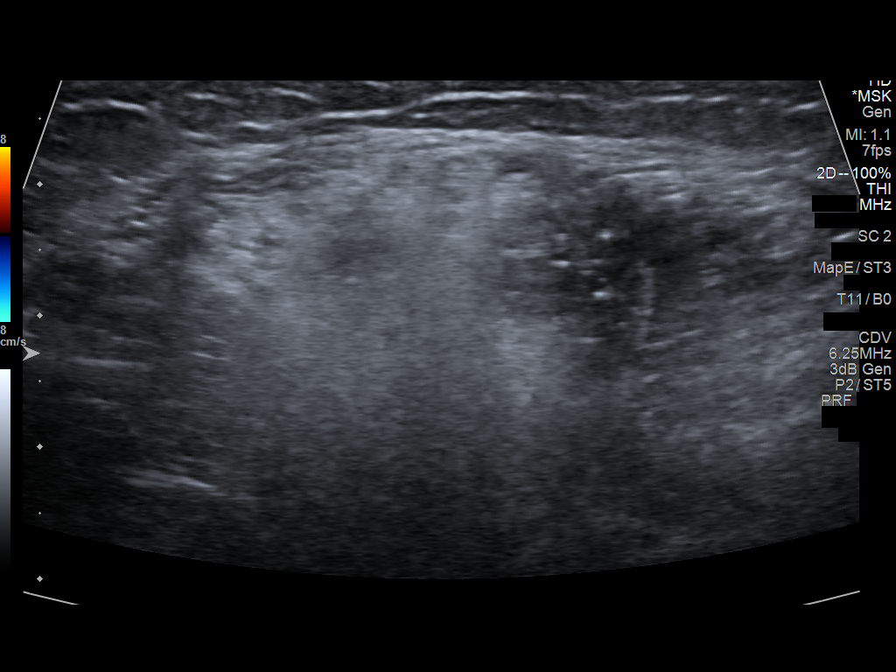

[8 of 8 positions shown; findings below may reference images not displayed]

FINDINGS: Targeted ultrasound was performed at site of patient's clinical
concern in the region of the right shoulder/deltoid area. Within the
deep soft tissues is a homogeneous echogenic mass measuring
approximately 6.3 x 3.1 x 2.8 cm. Given the depth and size, the
posterior margin of the mass are not well characterized. No internal
vascularity is evident with color Doppler.
IMPRESSION: Homogeneously echogenic 6.3 cm nonvascular soft tissue mass within
the deep soft tissues of the right shoulder region. Sonographic
features are most suggestive of a fatty mass such as a lipoma.
However, deep margins of the mass were unable to be evaluated given
the size and depth of the lesion. Further characterization with
contrast-enhanced MRI should be considered.

## 2021-03-09 IMAGING — US US SOFT TISSUE
1 series · 9 of 9 positions shown · non-contrast
Comparison: None.

CLINICAL DATA: Lump about the right scapula for over 20 years.
Question mass.

EXAM:
ULTRASOUND OF HEAD/NECK SOFT TISSUES
TECHNIQUE: Ultrasound examination of the head and neck soft tissues was
performed in the area of clinical concern.

[Series 1: us soft tissue · 0.11mm/px · 9 of 9 slices shown]
[im 1/9]
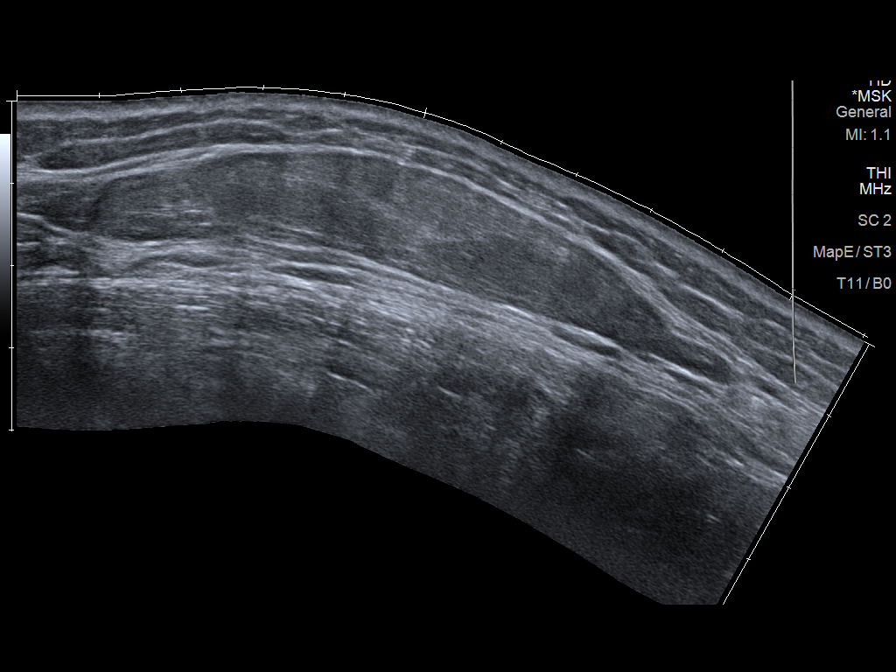
[im 2/9]
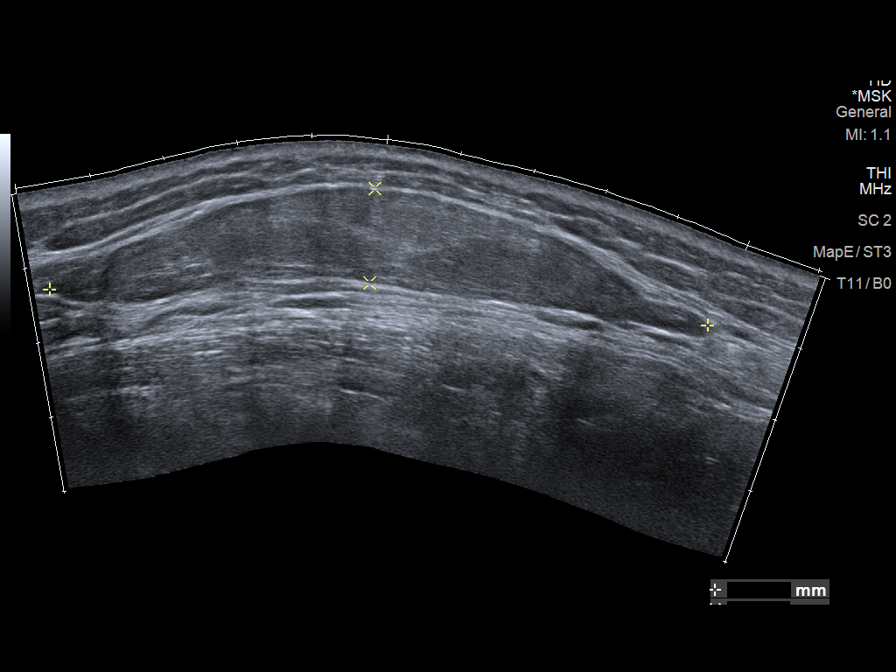
[im 3/9]
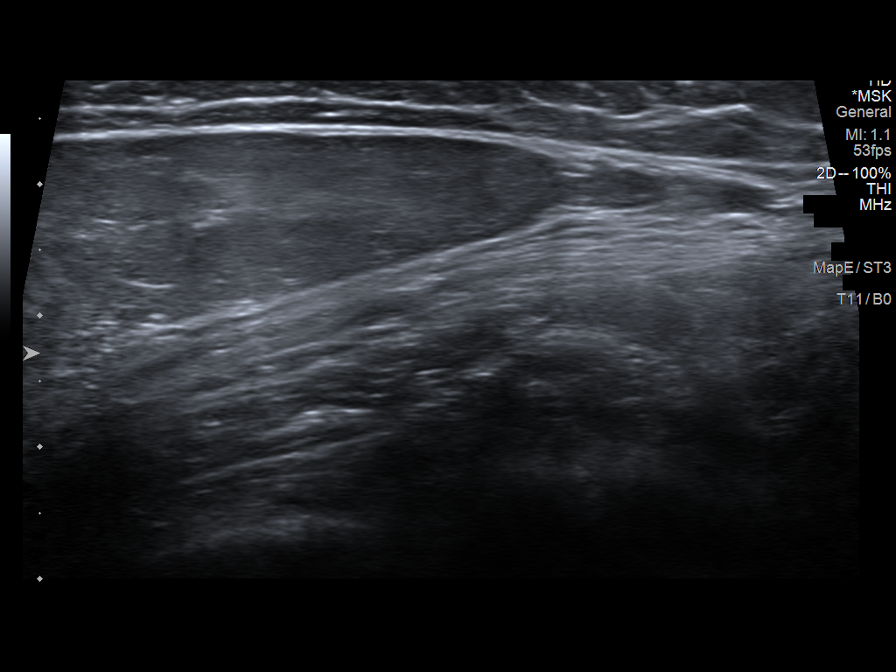
[im 4/9]
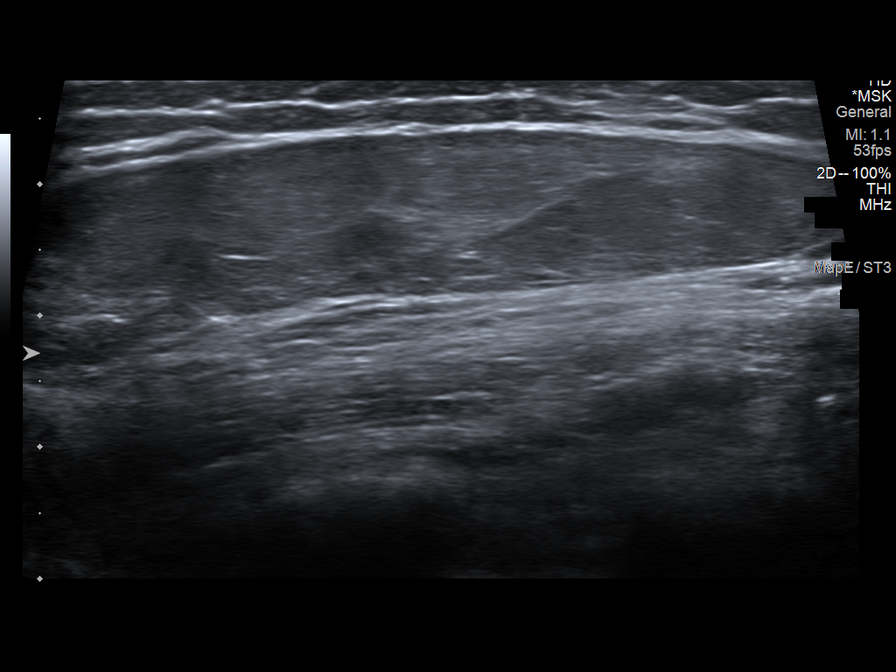
[im 5/9]
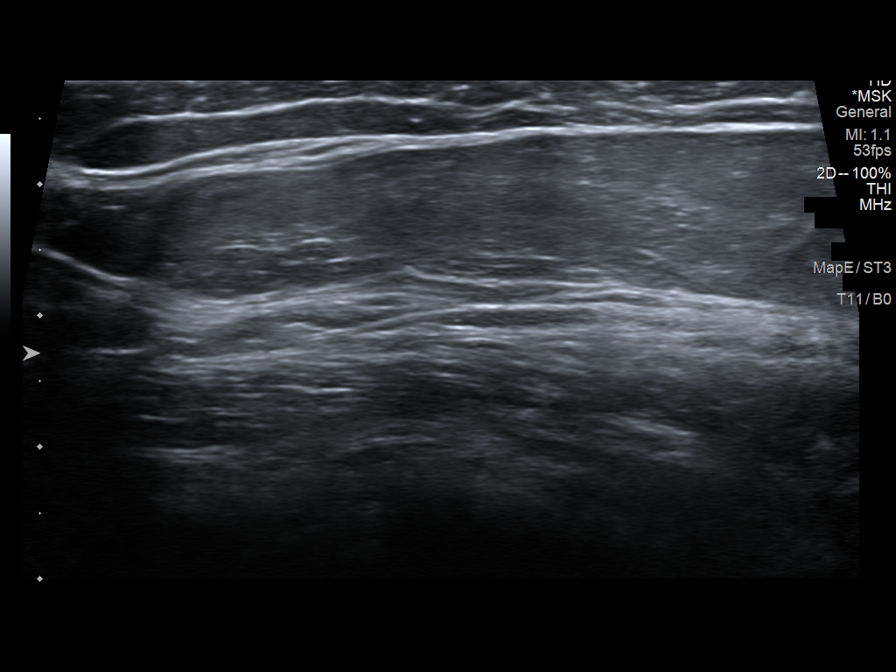
[im 6/9]
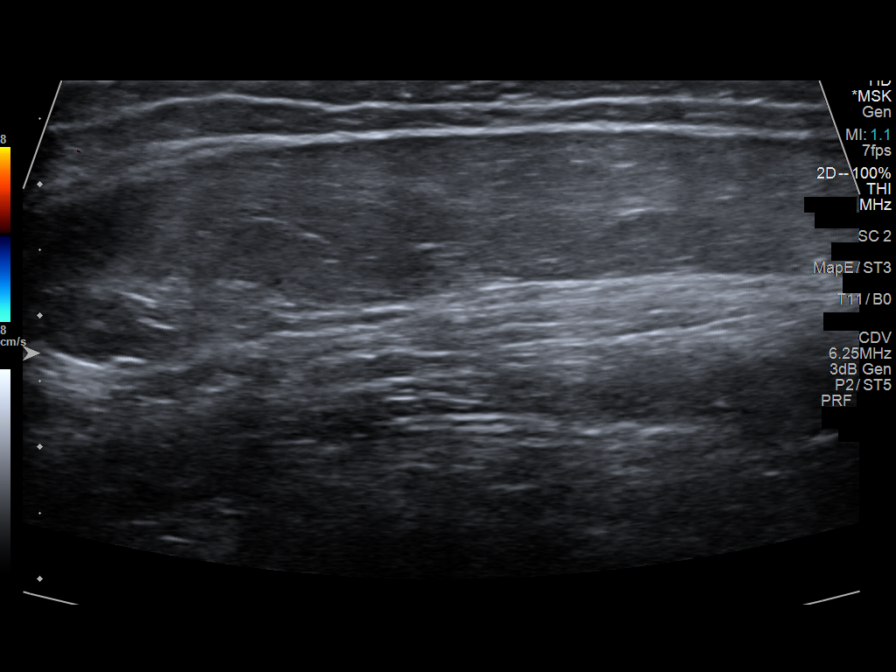
[im 7/9]
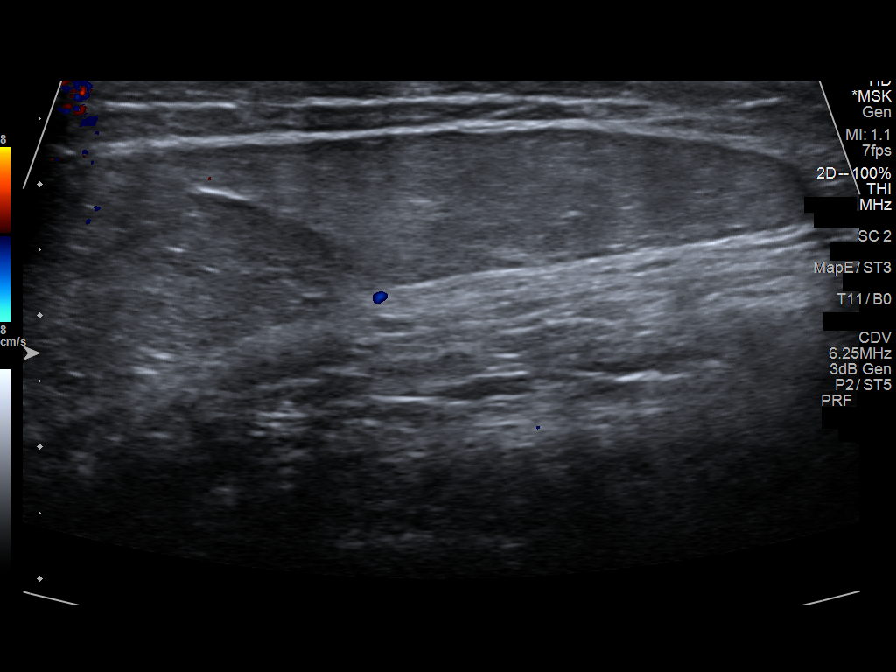
[im 8/9]
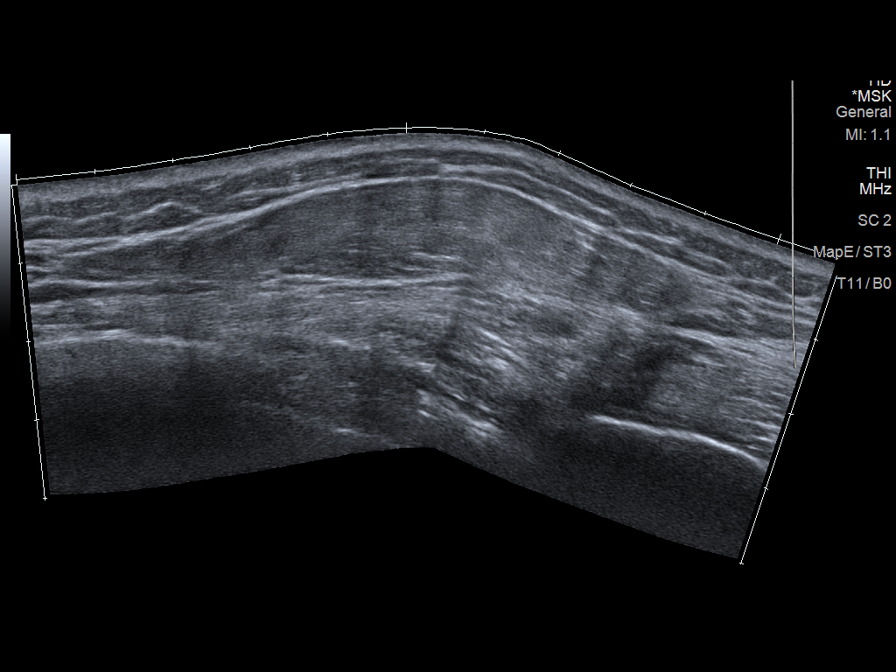
[im 9/9]
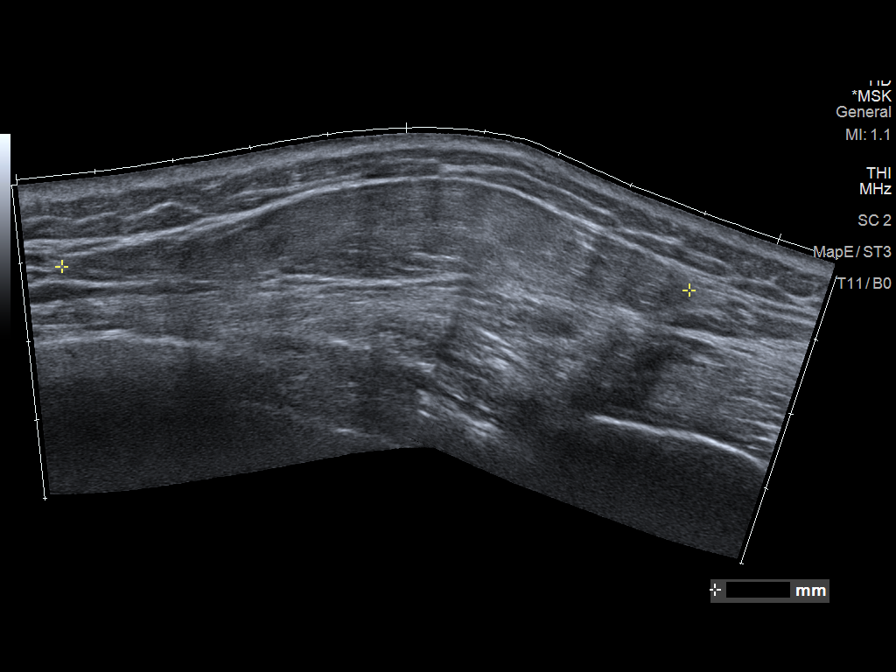

[9 of 9 positions shown; findings below may reference images not displayed]

FINDINGS: A well-circumscribed lesion in the subcutaneous tissues measures
x 1.3 x 8.0 cm. The lesion is isoechoic to adjacent fat. No flow on
Doppler imaging.
IMPRESSION: Findings most consistent with a simple lipoma in the region of
concern.

## 2021-03-09 IMAGING — US US EXTREM LOW*L* LIMITED
1 series · 13 of 13 positions shown · non-contrast
Comparison: None.

CLINICAL DATA: Painful lump on the left and right hips and thighs
for 2 months.

EXAM:
ULTRASOUND LEFT LOWER EXTREMITY LIMITED
TECHNIQUE: Ultrasound examination of the lower extremity soft tissues was
performed in the area of clinical concern.

[Series 1: us extrem low*left* limited · 0.07mm/px · 13 acquisitions, 13 frames shown]
[im 1/13]
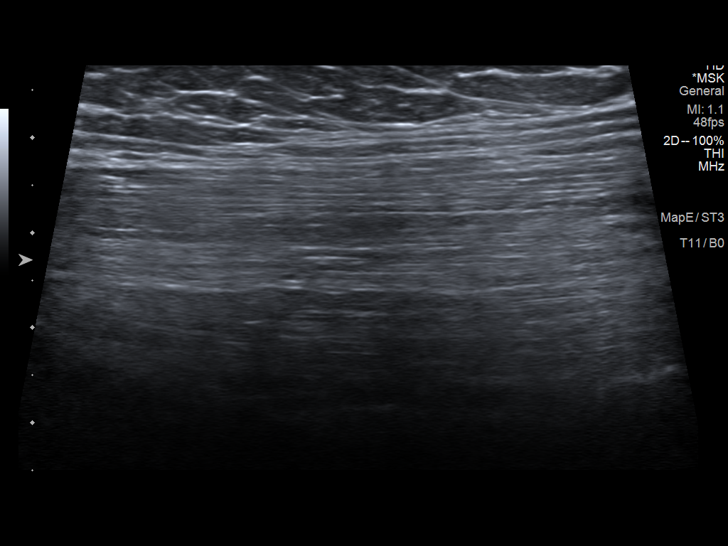
[im 2/13]
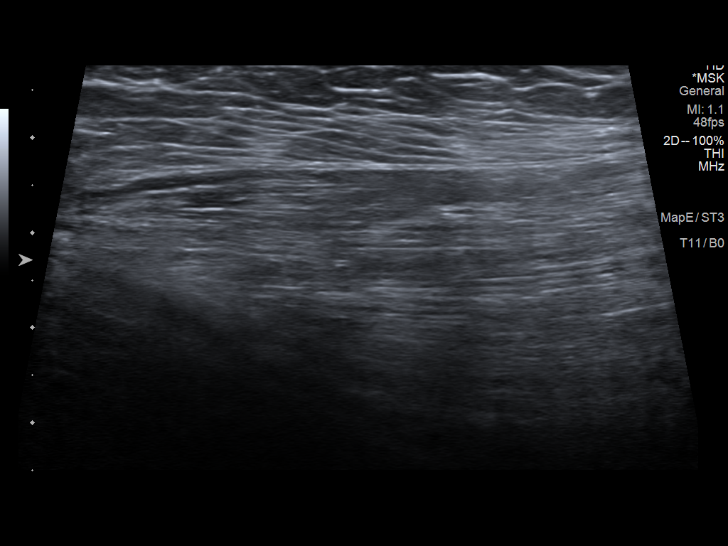
[im 3/13]
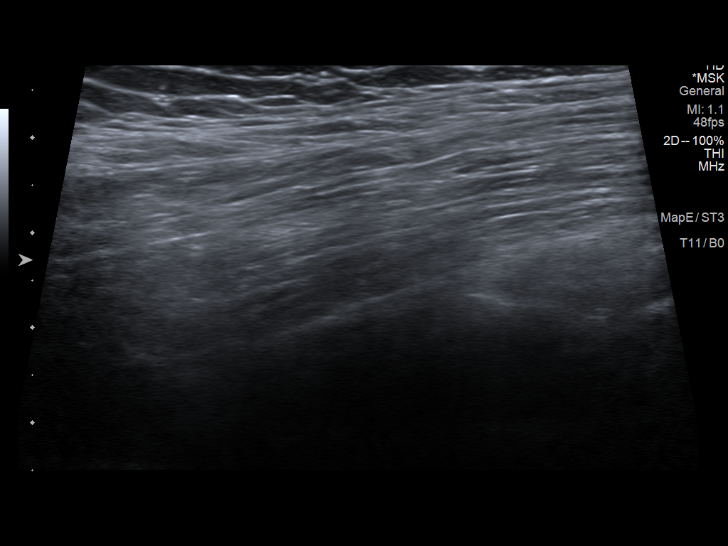
[im 4/13]
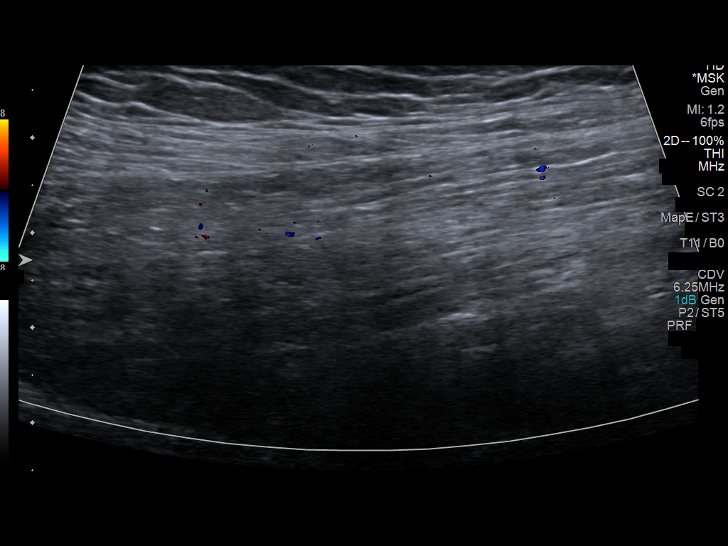
[im 5/13]
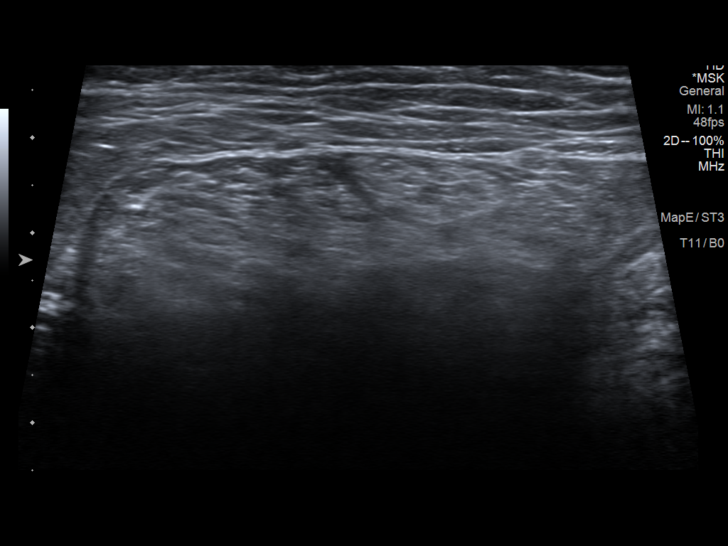
[im 6/13]
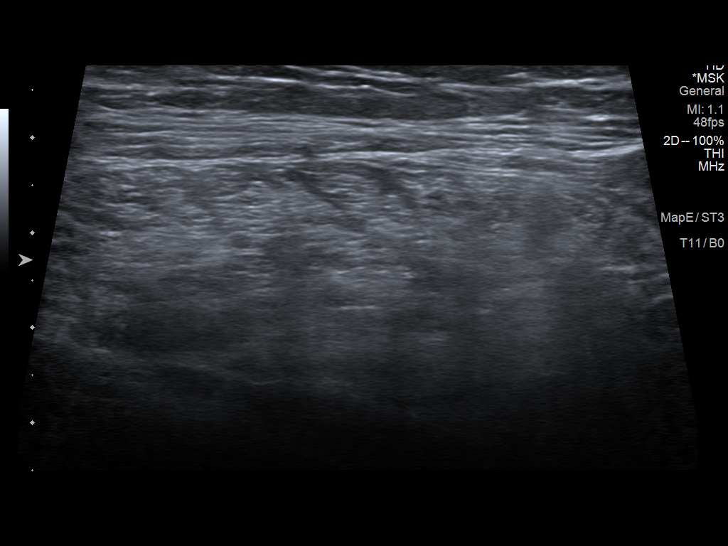
[im 7/13]
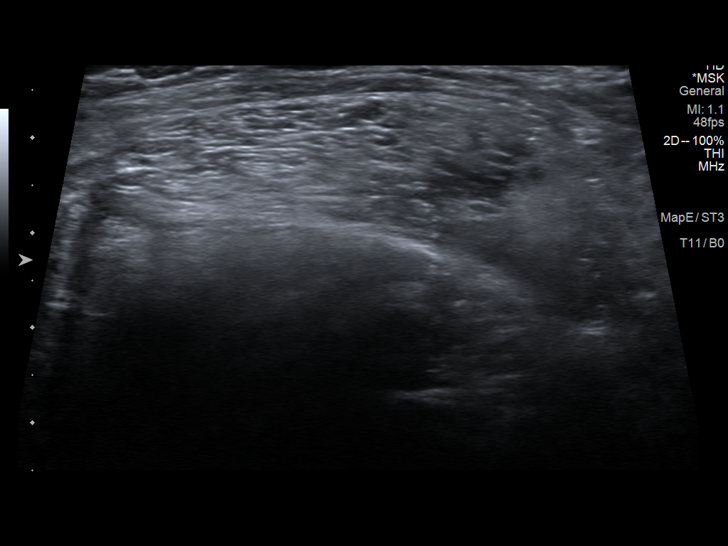
[im 8/13]
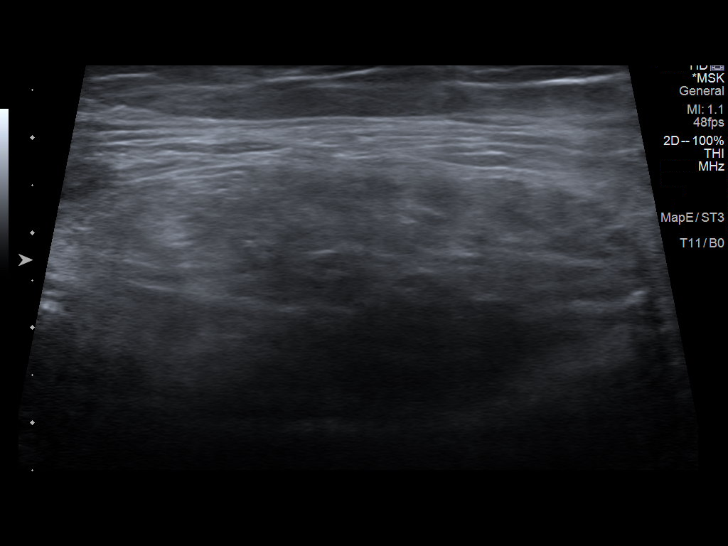
[im 9/13]
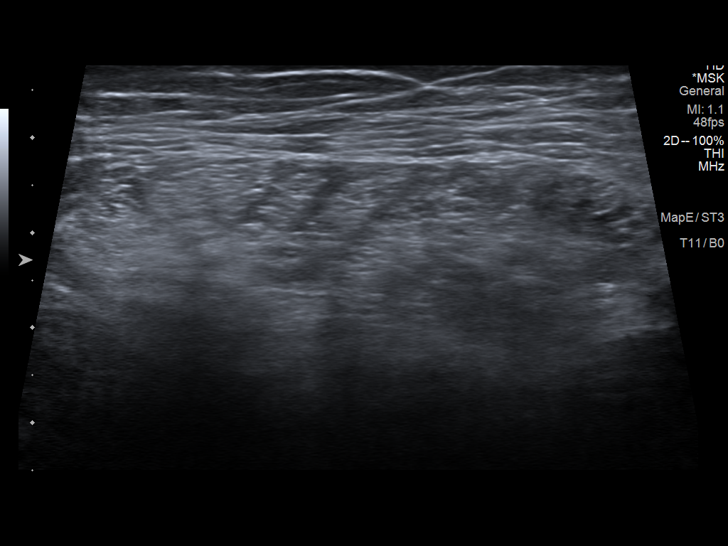
[im 10/13]
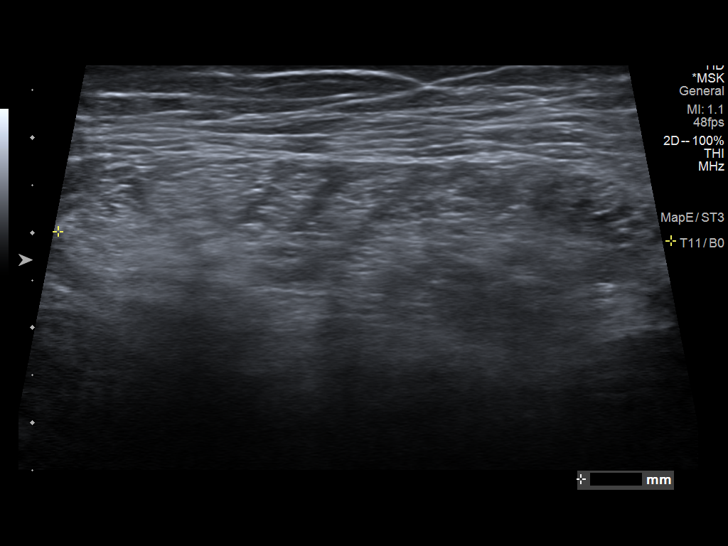
[im 11/13]
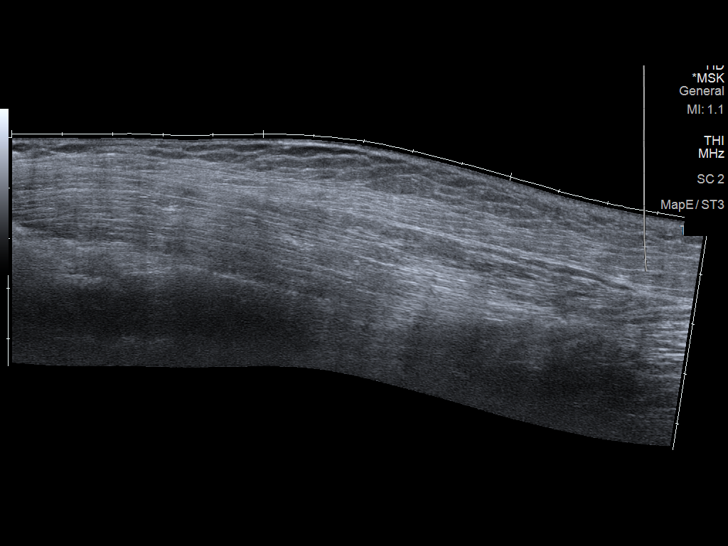
[im 12/13]
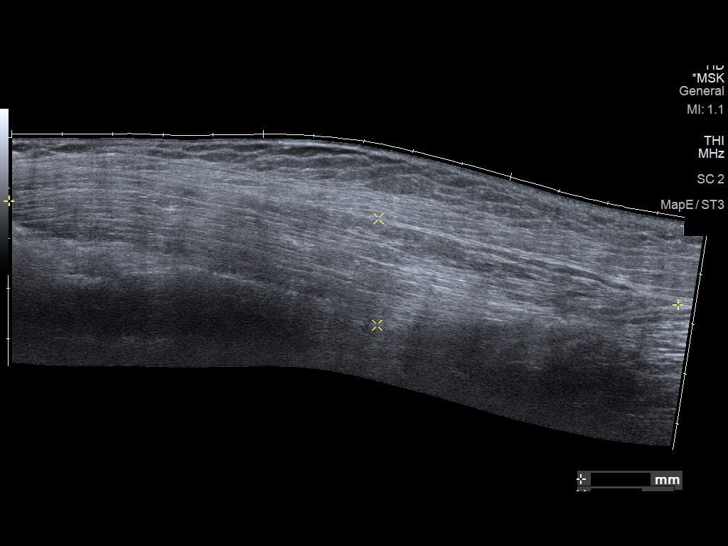
[im 13/13]
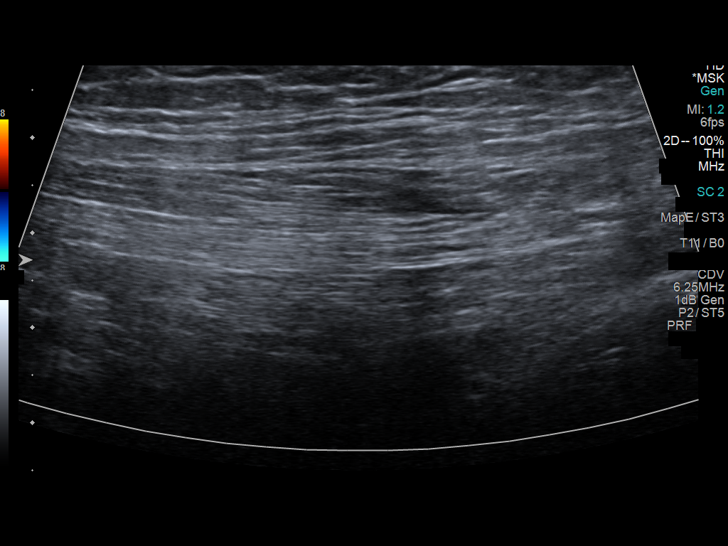

[13 of 13 positions shown; findings below may reference images not displayed]

FINDINGS: Scanning was directed toward the regions of concern. No mass, fluid
collection or other abnormality is seen.
IMPRESSION: Negative exam.  No finding to explain the patient's symptoms.

## 2021-03-09 IMAGING — US US EXTREM LOW*R* LIMITED
1 series · 14 of 14 positions shown · non-contrast
Comparison: None.

CLINICAL DATA: Painful lump on the anterior aspect of the right hip
and upper lateral 5 for 2 months.

EXAM:
ULTRASOUND RIGHT LOWER EXTREMITY LIMITED
TECHNIQUE: Ultrasound examination of the lower extremity soft tissues was
performed in the area of clinical concern.

[Series 1: us extrem low*right* limited · 14 acquisitions, 14 frames shown]
[im 1/14]
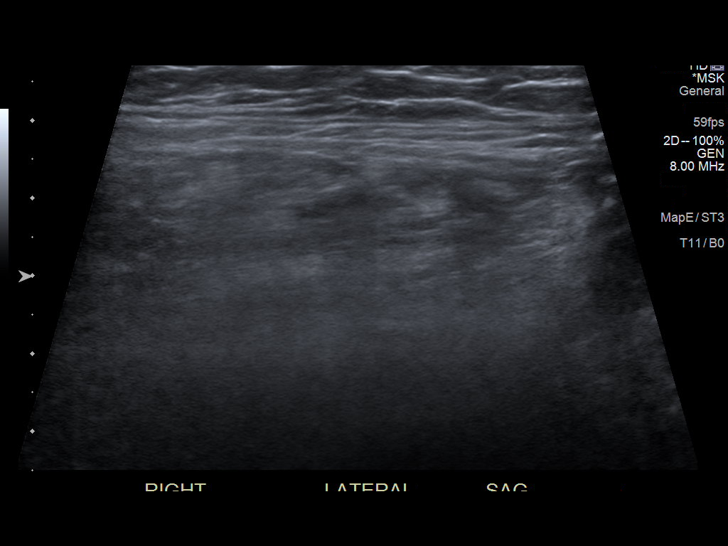
[im 2/14]
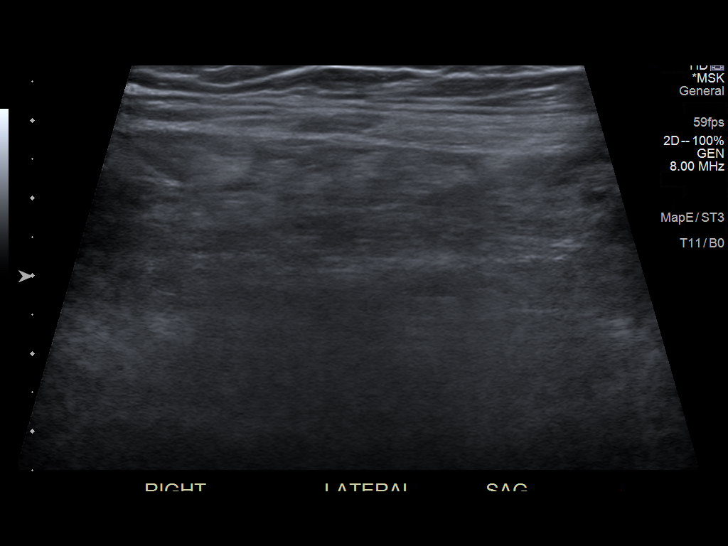
[im 3/14]
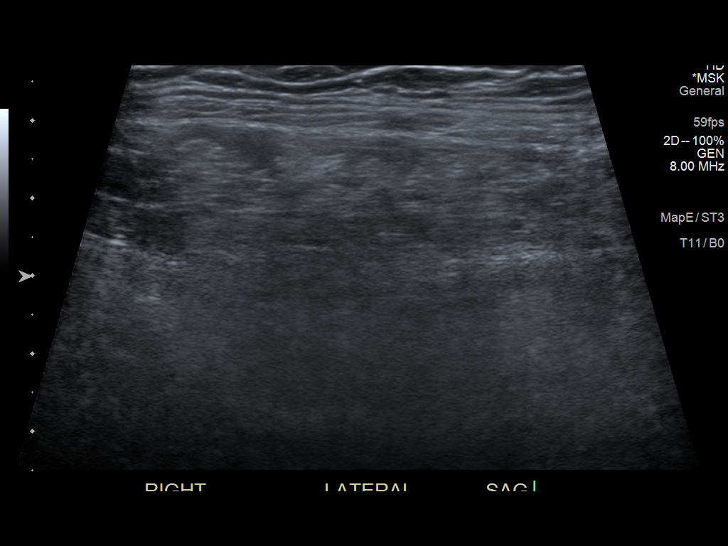
[im 4/14]
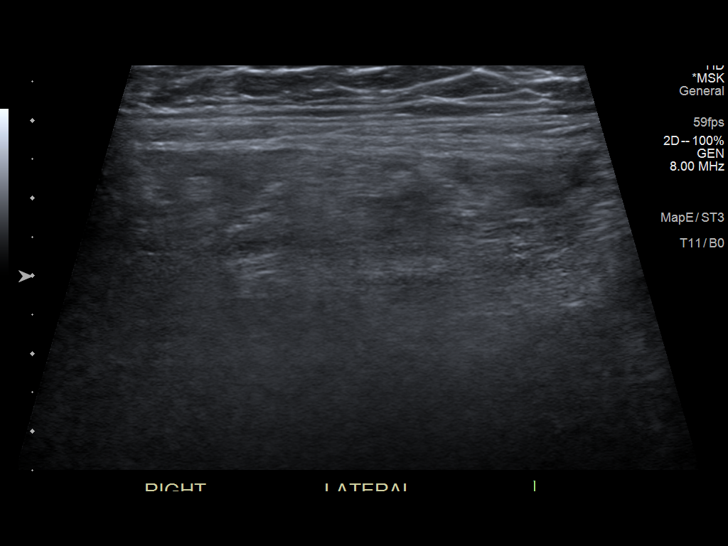
[im 5/14]
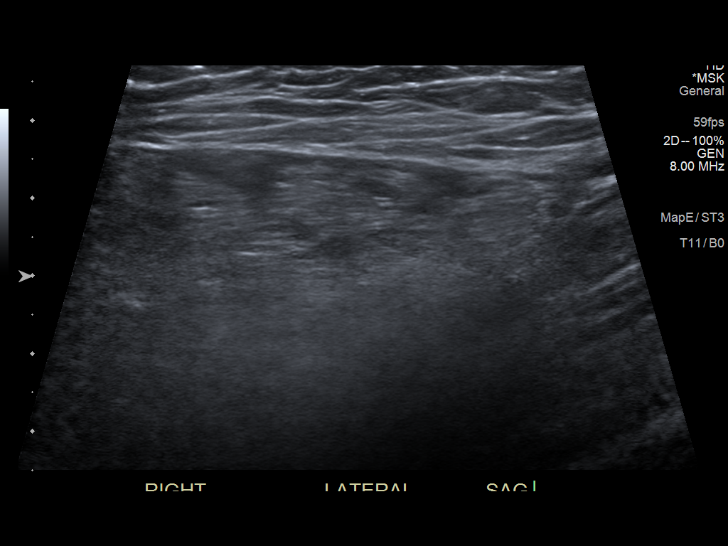
[im 6/14]
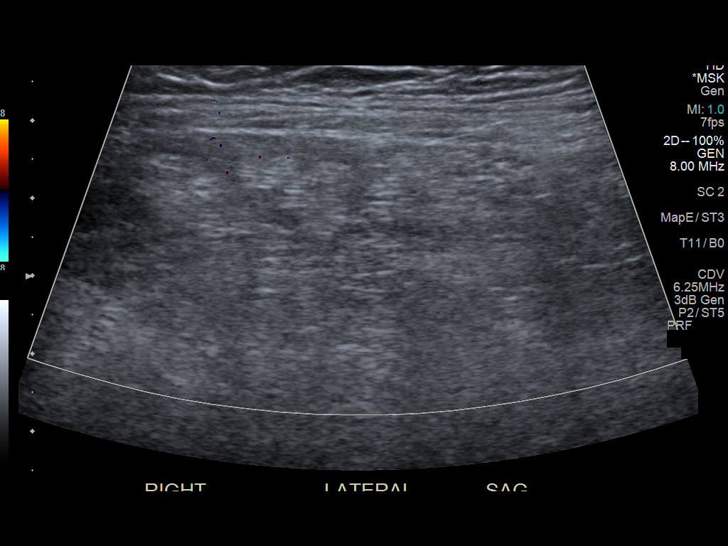
[im 7/14]
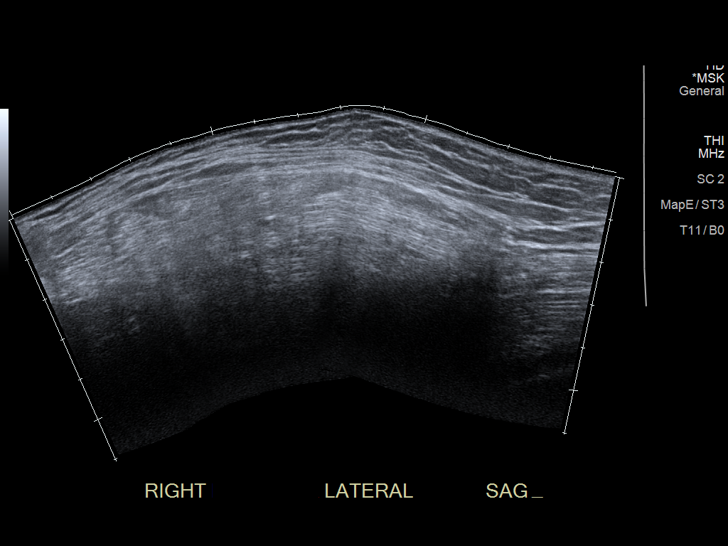
[im 8/14]
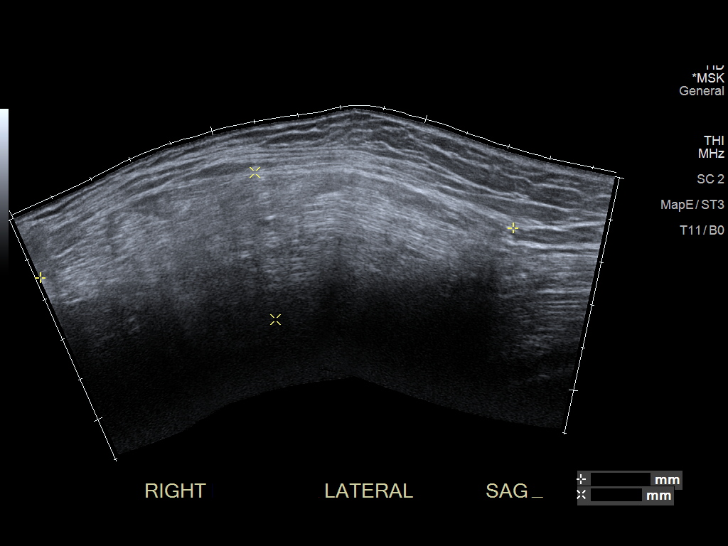
[im 9/14]
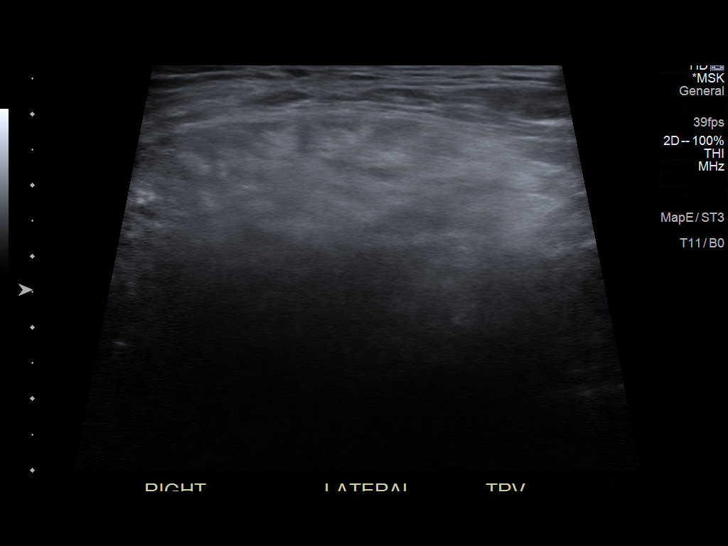
[im 10/14]
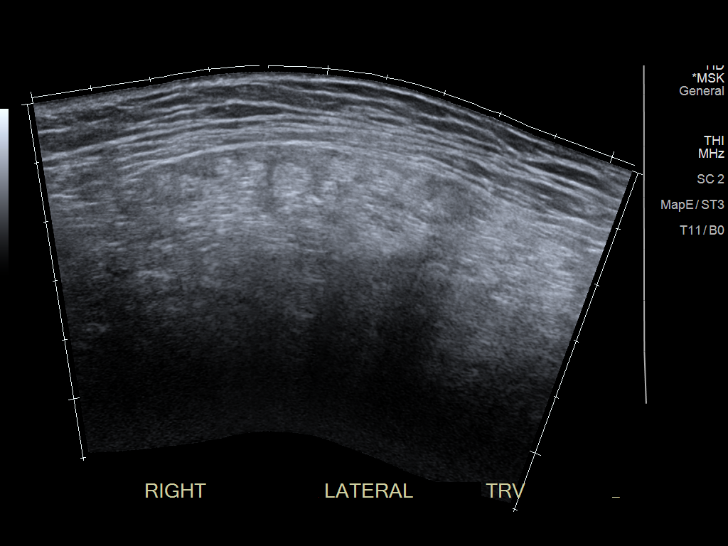
[im 11/14]
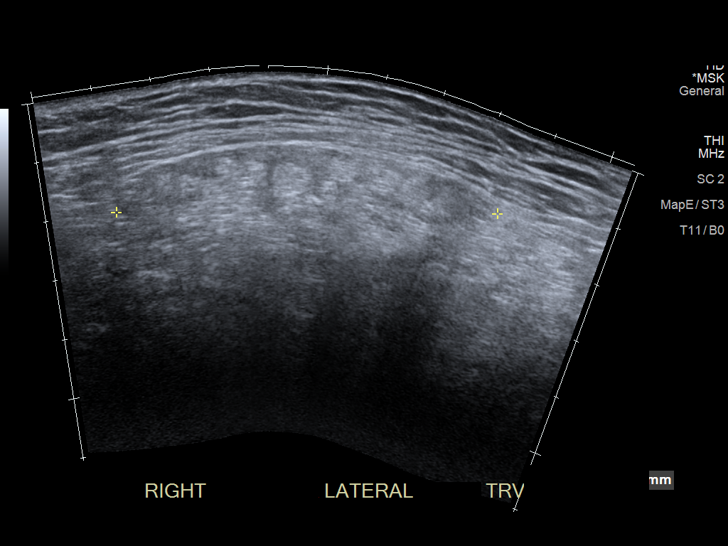
[im 12/14]
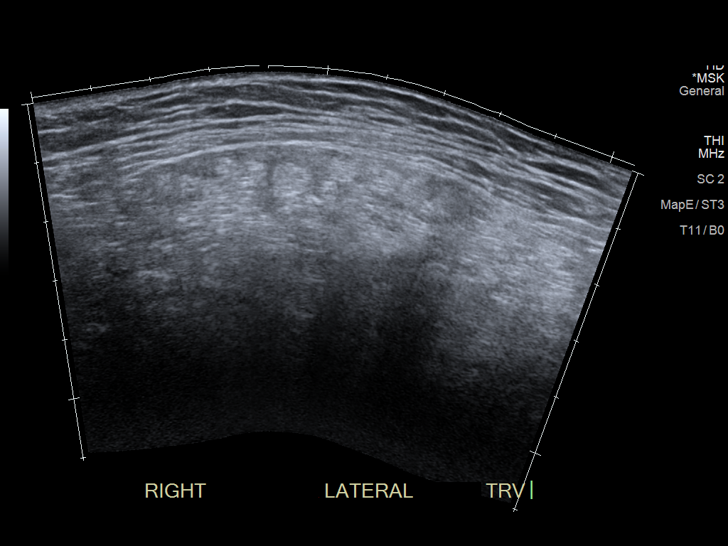
[im 13/14]
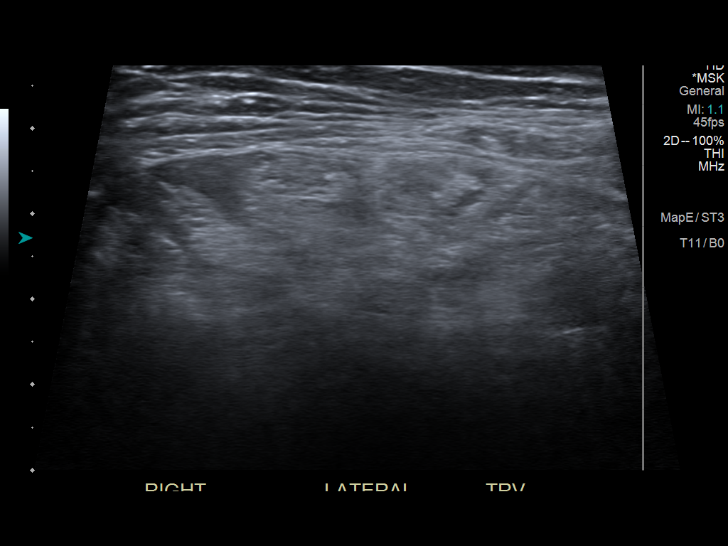
[im 14/14]
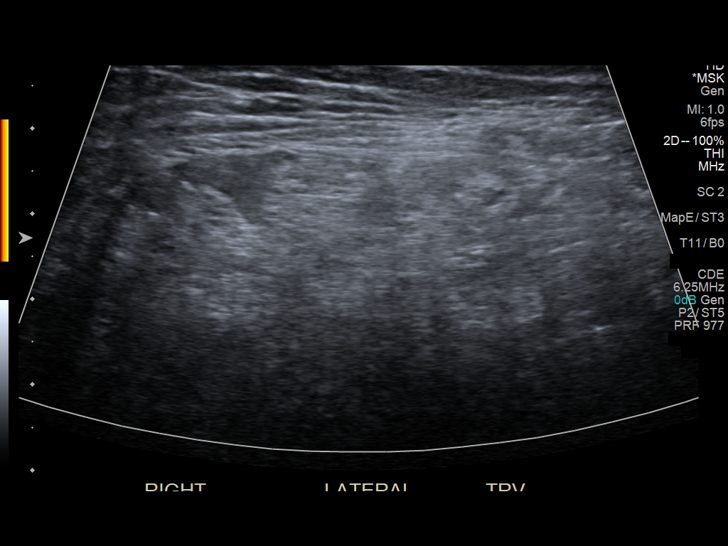

[14 of 14 positions shown; findings below may reference images not displayed]

FINDINGS: No fluid collection, mass or other abnormality is identified.
IMPRESSION: Negative exam.

## 2021-03-09 NOTE — Telephone Encounter (Signed)
Routing to covering provider. Thanks in advance.

## 2021-03-10 MED ORDER — TRAMADOL HCL 50 MG PO TABS: 100.0000 mg | ORAL_TABLET | Freq: Two times a day (BID) | ORAL | 0 refills | Status: DC | PRN

## 2021-03-11 ENCOUNTER — Other Ambulatory Visit: Payer: Self-pay

## 2021-03-11 ENCOUNTER — Ambulatory Visit (INDEPENDENT_AMBULATORY_CARE_PROVIDER_SITE_OTHER): Payer: Medicare Other | Admitting: Osteopathic Medicine

## 2021-03-11 ENCOUNTER — Encounter: Payer: Self-pay | Admitting: Osteopathic Medicine

## 2021-03-11 VITALS — BP 113/74 | HR 61 | Temp 97.6°F | Wt 172.1 lb

## 2021-03-11 DIAGNOSIS — Z6833 Body mass index (BMI) 33.0-33.9, adult: Secondary | ICD-10-CM | POA: Diagnosis not present

## 2021-03-11 DIAGNOSIS — E663 Overweight: Secondary | ICD-10-CM

## 2021-03-11 MED ORDER — PHENTERMINE HCL 15 MG PO CAPS: 15.0000 mg | ORAL_CAPSULE | ORAL | 0 refills | Status: DC

## 2021-03-11 MED ORDER — TOPIRAMATE 100 MG PO TABS: ORAL_TABLET | ORAL | 0 refills | Status: DC

## 2021-03-11 NOTE — Patient Instructions (Signed)
4 months phentermine is up!  Transitioning to lower dose phentermine + topiramate ("hack" for Rx Qsymia)  Let me know if any problems on new Rx, otherwise follow up in 3 months!

## 2021-03-11 NOTE — Progress Notes (Signed)
Jessica Lang is a 60 y.o. female who presents to  [place] at [facility]  today, 01/07/21, seeking care for the following:  Weight management, doing well on phentermine, weight loss as below   Wt Readings   03/11/21  Weight: 172 lb (-24 lb) Body mass index is 29.54 kg/m.  --> transition to phentermine + topamax   02/11/21  Weight: 177 lb (-19 lb = approx 9% BW from initial), Reviewed cardiology notes from recent visit,  --> refill phentermine, can do one more month or can transition to hopefully Qsymia   01/07/21 Weight: 186 lb (-10 lb = approx 6% BW), Body mass index is 31.95 kg/m. --> refill phentermine, advised come off this 2-3 days prior to cardiology appointment, plan to transition to Qsymia next 1-2 mos   12/09/20  Weight 188 lb (-8 lb = approx 4% BW), Body mass index is 32.29 kg/m. BP 138/72 --> refilled phentermine at 37.5 mg daily   11/11/20 First visit w/ me to discuss weight loss. Currently: 196 lb 0.6 oz (88.9 kg) Body mass index is 33.65 kg/m. --> started lower-dose Phentermine 18.75 mg daily, given list of Rx to contact insurance about coverage for long-term Rx   10/29/20 Visit w/ Dr T (sports med re: hip pain)- he discussed weight loss Rx w/ her and referred her to f/u w/ me   09/02/20 188 lb 11.2 oz (85.6 kg)  07/24/20 184 lb (83.5 kg)       ASSESSMENT & PLAN with other pertinent findings:  The encounter diagnosis was BMI 33.0-33.9,adult --> down to BMI 29.5 as of 03/11/21 w/ medically managed weight loss .   Meds ordered this encounter  Medications   phentermine 15 MG capsule    Sig: Take 1 capsule (15 mg total) by mouth every morning.    Dispense:  90 capsule    Refill:  0   topiramate (TOPAMAX) 100 MG tablet    Sig: Take 0.5 tablets (50 mg total) by mouth daily for 7 days, THEN 1 tablet (100 mg total) daily.    Dispense:  90 tablet    Refill:  0       Patient Instructions  4 months phentermine is up!   Transitioning to lower dose phentermine + topiramate ("hack" for Rx Qsymia)  Let me know if any problems on new Rx, otherwise follow up in 3 months!   No orders of the defined types were placed in this encounter.      See below for relevant physical exam findings  See below for recent lab and imaging results reviewed  Medications, allergies, PMH, PSH, SocH, FamH reviewed below    Follow-up instructions: Return in about 3 months (around 06/11/2021) for MEDICAL WEIGHT MANAGEMENT ON PHENTERMINE + TOPIRAMATE .                                        Exam:  BP 113/74 (BP Location: Left Arm, Patient Position: Sitting, Cuff Size: Normal)   Pulse 61   Temp 97.6 F (36.4 C) (Oral)   Wt 172 lb 1.3 oz (78.1 kg)   BMI 29.54 kg/m  Constitutional: VS see above. General Appearance: alert, well-developed, well-nourished, NAD Neck: No masses, trachea midline.  Respiratory: Normal respiratory effort. Neurological: Normal balance/coordination. No tremor. Skin: warm, dry, intact.  Psychiatric: Normal judgment/insight. Normal mood and affect. Oriented x3.  Current Meds  Medication Sig   FLUoxetine (PROZAC) 20 MG capsule Take 1 capsule (20 mg total) by mouth daily.   Magnesium 500 MG TABS Take 1,000 mg by mouth daily.    ondansetron (ZOFRAN ODT) 4 MG disintegrating tablet Take 1 tablet (4 mg total) by mouth every 8 (eight) hours as needed for nausea or vomiting.   OXcarbazepine (TRILEPTAL) 150 MG tablet Take 2 tablets (300 mg total) by mouth at bedtime.   phentermine 15 MG capsule Take 1 capsule (15 mg total) by mouth every morning.   POTASSIUM PO Take by mouth daily as needed.    topiramate (TOPAMAX) 100 MG tablet Take 0.5 tablets (50 mg total) by mouth daily for 7 days, THEN 1 tablet (100 mg total) daily.   traMADol (ULTRAM) 50 MG tablet Take 2 tablets (100 mg total) by mouth every 12 (twelve) hours as needed for moderate pain.   VITAMIN D,  CHOLECALCIFEROL, PO Take 5,000 Units by mouth daily.    Zinc 50 MG CAPS Take by mouth daily.    [DISCONTINUED] phentermine (ADIPEX-P) 37.5 MG tablet Take 1 tablet (37.5 mg total) by mouth daily before breakfast.   Current Facility-Administered Medications for the 03/11/21 encounter (Office Visit) with Emeterio Reeve, DO  Medication   promethazine (PHENERGAN) injection 25 mg    Allergies  Allergen Reactions   Lithium Other (See Comments)    More aggressive    Patient Active Problem List   Diagnosis Date Noted   Labral tear of right hip joint 09/11/2020   Cutaneous lipomatosis 09/11/2020   Diarrhea 07/24/2020   Depression    PTSD (post-traumatic stress disorder)    At risk for obstructive sleep apnea 10/12/2018   Subarachnoid hemorrhage ([place]) 09/01/2018   Bradyarrhythmia 03/17/2018   DOE (dyspnea on exertion) 03/17/2018   Chronic fatigue 02/21/2018   COPD (chronic obstructive pulmonary disease) ([place]) 07/07/2016   Chronic interstitial lung disease ([place]) 06/28/2016   Acute bronchitis 06/28/2016   COPD exacerbation ([place]) 06/28/2016   Otitis, externa, infective 01/19/2016   Tobacco abuse 09/11/2015   History of posttraumatic stress disorder (PTSD) 09/11/2015   S/P hysterectomy 09/11/2015   Family history of bipolar disorder 09/11/2015   Anxiety and depression 09/11/2015   Family history of schizophrenia 09/11/2015   BMI 33.0-33.9,adult 09/11/2015    Family History  Problem Relation Age of Onset   Diabetes Mother    Schizophrenia Mother    Angina Father     Social History   Tobacco Use  Smoking Status Former   Packs/day: 0.50   Years: 40.00   Pack years: 20.00   Types: Cigarettes   Start date: 09/26/2015   Quit date: 07/14/2020   Years since quitting: 0.6  Smokeless Tobacco Never  Tobacco Comments   working on quitting    Past Surgical History:  Procedure Laterality Date   ABDOMINAL HYSTERECTOMY     CESAREAN SECTION     3   CHOLECYSTECTOMY     IR ANGIO  INTRA EXTRACRAN SEL COM CAROTID INNOMINATE UNI R MOD SED  09/11/2018   IR ANGIO INTRA EXTRACRAN SEL INTERNAL CAROTID UNI L MOD SED  09/11/2018   IR ANGIO VERTEBRAL SEL VERTEBRAL UNI R MOD SED  09/11/2018   IR US GUIDE VASC ACCESS RIGHT  09/11/2018   OOPHORECTOMY     one ovary removed    Immunization History  Administered Date(s) Administered   PFIZER(Purple Top)SARS-COV-2 Vaccination 05/08/2020, 05/29/2020   Tdap 09/16/2015    No results found for this or  any previous visit (from the past 2160 hour(s)).  No results found.     All questions at time of visit were answered - patient instructed to contact office with any additional concerns or updates. ER/RTC precautions were reviewed with the patient as applicable.   Please note: manual typing as well as voice recognition software may have been used to produce this document - typos may escape review. Please contact Dr. Sheppard Coil for any needed clarifications.

## 2021-03-20 ENCOUNTER — Telehealth (INDEPENDENT_AMBULATORY_CARE_PROVIDER_SITE_OTHER): Payer: Medicare Other | Admitting: Psychiatry

## 2021-03-20 ENCOUNTER — Encounter (HOSPITAL_COMMUNITY): Payer: Self-pay | Admitting: Psychiatry

## 2021-03-20 DIAGNOSIS — F411 Generalized anxiety disorder: Secondary | ICD-10-CM

## 2021-03-20 DIAGNOSIS — F332 Major depressive disorder, recurrent severe without psychotic features: Secondary | ICD-10-CM | POA: Diagnosis not present

## 2021-03-20 MED ORDER — FLUOXETINE HCL 20 MG PO CAPS: 20.0000 mg | ORAL_CAPSULE | Freq: Every day | ORAL | 3 refills | Status: DC

## 2021-03-20 MED ORDER — OXCARBAZEPINE 150 MG PO TABS: 300.0000 mg | ORAL_TABLET | Freq: Every day | ORAL | 1 refills | Status: DC
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
## 2021-04-07 ENCOUNTER — Ambulatory Visit (INDEPENDENT_AMBULATORY_CARE_PROVIDER_SITE_OTHER): Payer: Medicare Other | Admitting: Sports Medicine

## 2021-04-07 ENCOUNTER — Other Ambulatory Visit: Payer: Self-pay

## 2021-04-07 ENCOUNTER — Ambulatory Visit: Payer: Self-pay

## 2021-04-07 DIAGNOSIS — E882 Lipomatosis, not elsewhere classified: Secondary | ICD-10-CM | POA: Diagnosis not present

## 2021-04-07 DIAGNOSIS — S73191D Other sprain of right hip, subsequent encounter: Secondary | ICD-10-CM

## 2021-04-07 NOTE — Progress Notes (Signed)
    Procedures performed today:    Procedure: Real-time Ultrasound Guided injection of the right hip joint Device: Samsung HS60  Verbal informed consent obtained.  Time-out conducted.  Noted no overlying erythema, induration, or other signs of local infection.  Skin prepped in a sterile fashion.  Local anesthesia: Topical Ethyl chloride.  With sterile technique and under real time ultrasound guidance: Noted normal-appearing hip joint, trace effusion, 1 cc Kenalog 40, 2 cc lidocaine, 2 cc bupivacaine injected easily Completed without difficulty  Advised to call if fevers/chills, erythema, induration, drainage, or persistent bleeding.  Images permanently stored and available for review in PACS.  Impression: Technically successful ultrasound guided injection.  Independent interpretation of notes and tests performed by another provider:   None.  Brief History, Exam, Impression, and Recommendations:    Labral tear of right hip joint Jessica Lang returns, she is a pleasant 60 year old female, confirmed hip labral tear with minimal osteoarthritis with an arthrogram, she did have good relief for several weeks from the injection. She is desiring a repeat injection today, my hope is that since we are just doing a steroid injection without arthrography she will get longer period of relief. If not, due to the arthritis she is likely a candidate more for arthroplasty rather than labral repair. Return to see me as needed.  Cutaneous lipomatosis Jessica Lang has a few lipomas, the largest of which is on her posterior right shoulder, she will go ahead and schedule to have this removed, she has another 1 over the deltoid, it feels to be somewhat intramuscular, she also has 1 over the trapezius, these are asymptomatic and I have advised her to leave them alone. The one on the posterior right shoulder is painful and tends to interfere with her bra.    ___________________________________________ Gwen Her.  Dianah Field, M.D., ABFM., CAQSM. Primary Care and [place] Instructor of [place] of [facility]

## 2021-04-07 NOTE — Assessment & Plan Note (Signed)
Roz has a few lipomas, the largest of which is on her posterior right shoulder, she will go ahead and schedule to have this removed, she has another 1 over the deltoid, it feels to be somewhat intramuscular, she also has 1 over the trapezius, these are asymptomatic and I have advised her to leave them alone. The one on the posterior right shoulder is painful and tends to interfere with her bra.

## 2021-04-07 NOTE — Assessment & Plan Note (Signed)
Jessica Lang returns, she is a pleasant 60 year old female, confirmed hip labral tear with minimal osteoarthritis with an arthrogram, she did have good relief for several weeks from the injection. She is desiring a repeat injection today, my hope is that since we are just doing a steroid injection without arthrography she will get longer period of relief. If not, due to the arthritis she is likely a candidate more for arthroplasty rather than labral repair. Return to see me as needed.

## 2021-04-09 ENCOUNTER — Encounter: Payer: Self-pay | Admitting: Osteopathic Medicine

## 2021-04-09 DIAGNOSIS — G8929 Other chronic pain: Secondary | ICD-10-CM

## 2021-04-22 ENCOUNTER — Ambulatory Visit (INDEPENDENT_AMBULATORY_CARE_PROVIDER_SITE_OTHER): Payer: Medicare Other | Admitting: Sports Medicine

## 2021-04-22 DIAGNOSIS — E882 Lipomatosis, not elsewhere classified: Secondary | ICD-10-CM | POA: Diagnosis not present

## 2021-04-22 DIAGNOSIS — S73191D Other sprain of right hip, subsequent encounter: Secondary | ICD-10-CM | POA: Diagnosis not present

## 2021-04-22 MED ORDER — TRAMADOL HCL 50 MG PO TABS: 100.0000 mg | ORAL_TABLET | Freq: Two times a day (BID) | ORAL | 0 refills | Status: DC | PRN

## 2021-04-22 MED ORDER — HYDROCODONE-ACETAMINOPHEN 5-325 MG PO TABS: 1.0000 | ORAL_TABLET | Freq: Three times a day (TID) | ORAL | 0 refills | Status: DC | PRN

## 2021-04-22 NOTE — Assessment & Plan Note (Addendum)
Several lipomas, the most symptomatic of which is on the right posterior shoulder, excised today. Return to see me in 2 weeks for wound check. Hydrocodone for postoperative pain.

## 2021-04-22 NOTE — Progress Notes (Signed)
    Procedures performed today:    Procedure:  Excision of 5 cm right posterior shoulder lipoma Risks, benefits, and alternatives explained and consent obtained. Time out conducted. Surface prepped with alcohol. 15cc lidocaine with epinephine infiltrated in a field block. Adequate anesthesia ensured. Area prepped and draped in a sterile fashion. Excision performed with: Using a #10 blade I made a linear incision, using both sharp and blunt dissection I was able to remove the lipoma en bloc, I then closed it with #3 simple interrupted deep dermal 3-0 Vicryl sutures, I then placed a running subcuticular 4-0 Vicryl suture to close the skin. Hemostasis achieved. Pt stable.  Independent interpretation of notes and tests performed by another provider:   None.  Brief History, Exam, Impression, and Recommendations:    Cutaneous lipomatosis Several lipomas, the most symptomatic of which is on the right posterior shoulder, excised today. Return to see me in 2 weeks for wound check.    ___________________________________________ Gwen Her. Dianah Field, M.D., ABFM., CAQSM. Primary Care and [place] Instructor of [place] of [facility]

## 2021-04-22 NOTE — Patient Instructions (Signed)
Incision Care, Adult An incision is a surgical cut that is made through your skin. Most incisions are closed after a surgical procedure. Your incision may be closed with stitches (sutures), staples, skin glue, or adhesive strips. You may need to return to your health care provider to have sutures or staples removed. This may occur several days or several weeks after your surgery. Until then, the incision needs to be cared for properly to prevent infection. Follow instructions from your healthcare provider about how to care for your incision. Supplies needed: Soap, water, and a clean hand towel. Wound cleanser. A clean bandage (dressing), if needed. Cream or ointment, if told by your health care provider. Clean gauze. How to care for your incision Cleaning the incision Ask your health care provider how to clean the incision. This may include: Using mild soap and water, or wound cleanser. Using a clean gauze to pat the incision dry after cleaning it. Dressing changes Wash your hands with soap and water for at least 20 seconds before and after you change the dressing. If soap and water are not available, use hand sanitizer. Change your dressing as told by your health care provider. Leave sutures, staples, skin glue, or adhesive strips in place. These skin closures may need to stay in place for 2 weeks or longer. If adhesive strip edges start to loosen and curl up, you may trim the loose edges. Do not remove adhesive strips completely unless your health care provider tells you to do that. Apply cream or ointment. Do this only as told by your health care provider. Cover the incision with a clean dressing. Ask your health care provider when you can begin leaving the incision uncovered. Checking for infection Check your incision area every day for signs of infection. Check for: More redness, swelling, or pain. More fluid or blood. Warmth. Pus or a bad smell.  Follow these instructions at  home Medicines Take over-the-counter and prescription medicines only as told by your health care provider. If you were prescribed an antibiotic medicine, cream, or ointment, take or apply it as told by your health care provider. Do not stop using the antibiotic even if your condition improves. Eating and drinking Eat a diet that includes protein, vitamin A, vitamin C, and other nutrient-rich foods to help the wound heal. Foods rich in protein include meat, fish, eggs, dairy, beans, and nuts. Foods rich in vitamin A include carrots and dark green, leafy vegetables. Foods rich in vitamin C include citrus fruits, tomatoes, broccoli, and peppers. Drink enough fluid to keep your urine pale yellow. General instructions  Do not take baths, swim, use a hot tub, or do anything that would put the incision underwater until your health care provider approves. Ask your health care provider if you may take showers. You may only be allowed to take sponge baths. Limit movement around your incision to promote healing. Avoid straining, lifting, or exercising for the first 2 weeks after your procedure, or for as long as told by your health care provider. Return to your normal activities as told by your health care provider. Ask your health care provider what activities are safe for you. Do not scratch or pick at the incision. Keep it covered as told by your health care provider. Protect your incision from the sun when you are outside for the first 6 months, or for as long as told by your health care provider. Cover up the scar area or apply sunscreen that has an SPF of  at least 30. Do not use any products that contain nicotine or tobacco, such as cigarettes, e-cigarettes, and chewing tobacco. These can delay incision healing after surgery. If you need help quitting, ask your health care provider. Keep all follow-up visits as told by your health care provider. This is important.  Contact a health care provider  if: You have any of these signs of infection: More redness, swelling, or pain around your incision. More fluid or blood coming from your incision. Warmth coming from your incision. Pus or a bad smell coming from your incision. A fever. You are nauseous or you vomit. You are dizzy. Your sutures, staples, skin glue, or adhesive strips come undone. Get help right away if: You have a red streak on the skin near your incision. Your incision bleeds through the dressing and the bleeding does not stop with gentle pressure. The edges of your incision open up and separate. You have signs of a serious bodily reaction to an infection. These signs may include: Fever, shaking chills, or feeling very cold. Confusion or anxiety. Severe pain. Trouble breathing. Fast heartbeat. Clammy or sweaty skin. A rash. These symptoms may represent a serious problem that is an emergency. Do not wait to see if the symptoms will go away. Get medical help right away. Call your local emergency services (911 in the [place] Do not drive yourself to the hospital. Summary Follow instructions from your health care provider about how to care for your incision. Wash your hands with soap and water for at least 20 seconds before and after you change the dressing. If soap and water are not available, use hand sanitizer. Check your incision area every day for signs of infection. Keep all follow-up visits as told by your health care provider. This is important. This information is not intended to replace advice given to you by your health care provider. Make sure you discuss any questions you have with your healthcare provider. Document Revised: 06/20/2019 Document Reviewed: 06/20/2019 Elsevier Patient Education  [place].

## 2021-05-06 ENCOUNTER — Ambulatory Visit: Payer: Medicare Other | Admitting: Sports Medicine

## 2021-05-08 ENCOUNTER — Ambulatory Visit (INDEPENDENT_AMBULATORY_CARE_PROVIDER_SITE_OTHER): Payer: Medicare Other | Admitting: Sports Medicine

## 2021-05-08 ENCOUNTER — Other Ambulatory Visit: Payer: Self-pay

## 2021-05-08 ENCOUNTER — Encounter: Payer: Self-pay | Admitting: Sports Medicine

## 2021-05-08 DIAGNOSIS — D171 Benign lipomatous neoplasm of skin and subcutaneous tissue of trunk: Secondary | ICD-10-CM

## 2021-05-08 NOTE — Assessment & Plan Note (Signed)
Jessica Lang is a pleasant 60 year old female, she is 2 weeks postop from a right posterior shoulder lipoma excision, she has absorbable sutures in 2 layers, incision is clean, dry, intact, there was a bit of suture protruding so we clipped it and she can come back to see me as needed.

## 2021-05-08 NOTE — Progress Notes (Signed)
    Procedures performed today:    None.  Independent interpretation of notes and tests performed by another provider:   None.  Brief History, Exam, Impression, and Recommendations:    Lipoma Jessica Lang is a pleasant 60 year old female, she is 2 weeks postop from a right posterior shoulder lipoma excision, she has absorbable sutures in 2 layers, incision is clean, dry, intact, there was a bit of suture protruding so we clipped it and she can come back to see me as needed.    ___________________________________________ Gwen Her. Dianah Field, M.D., ABFM., CAQSM. Primary Care and [place] Instructor of [place] of [facility]

## 2021-06-06 ENCOUNTER — Other Ambulatory Visit: Payer: Self-pay | Admitting: Osteopathic Medicine

## 2021-06-11 ENCOUNTER — Other Ambulatory Visit: Payer: Self-pay

## 2021-06-11 DIAGNOSIS — S73191D Other sprain of right hip, subsequent encounter: Secondary | ICD-10-CM

## 2021-06-12 MED ORDER — TRAMADOL HCL 50 MG PO TABS: 100.0000 mg | ORAL_TABLET | Freq: Two times a day (BID) | ORAL | 0 refills | Status: DC | PRN

## 2021-06-15 ENCOUNTER — Ambulatory Visit (INDEPENDENT_AMBULATORY_CARE_PROVIDER_SITE_OTHER): Payer: Medicare Other | Admitting: Family Medicine

## 2021-06-15 ENCOUNTER — Encounter: Payer: Self-pay | Admitting: Family Medicine

## 2021-06-15 ENCOUNTER — Ambulatory Visit: Payer: Medicare Other | Admitting: Osteopathic Medicine

## 2021-06-15 DIAGNOSIS — Z6833 Body mass index (BMI) 33.0-33.9, adult: Secondary | ICD-10-CM | POA: Diagnosis not present

## 2021-06-15 DIAGNOSIS — E663 Overweight: Secondary | ICD-10-CM

## 2021-06-15 MED ORDER — PHENTERMINE HCL 15 MG PO CAPS: 15.0000 mg | ORAL_CAPSULE | ORAL | 0 refills | Status: DC
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
## 2021-06-15 NOTE — Assessment & Plan Note (Signed)
She has obesity with comorbidities.  Unfortunately she did not tolerate topiramate and alternative weight loss medications are not covered by her insurance.  We will continue phentermine at 15 mg daily to help with weight management.  We discussed working on dietary change and reviewed calorie counting to help with weight loss.

## 2021-06-15 NOTE — Patient Instructions (Signed)

## 2021-06-18 ENCOUNTER — Encounter: Payer: Self-pay | Admitting: Family Medicine

## 2021-07-09 ENCOUNTER — Ambulatory Visit (INDEPENDENT_AMBULATORY_CARE_PROVIDER_SITE_OTHER): Payer: Medicare Other | Admitting: Plastic Surgery

## 2021-07-09 ENCOUNTER — Encounter: Payer: Self-pay | Admitting: Plastic Surgery

## 2021-07-09 ENCOUNTER — Other Ambulatory Visit: Payer: Self-pay

## 2021-07-09 VITALS — BP 112/72 | HR 65 | Ht 64.0 in | Wt 169.0 lb

## 2021-07-09 DIAGNOSIS — M545 Low back pain, unspecified: Secondary | ICD-10-CM | POA: Diagnosis not present

## 2021-07-09 DIAGNOSIS — N62 Hypertrophy of breast: Secondary | ICD-10-CM | POA: Diagnosis not present

## 2021-07-09 DIAGNOSIS — Z1231 Encounter for screening mammogram for malignant neoplasm of breast: Secondary | ICD-10-CM

## 2021-07-09 DIAGNOSIS — M4004 Postural kyphosis, thoracic region: Secondary | ICD-10-CM

## 2021-07-09 DIAGNOSIS — M546 Pain in thoracic spine: Secondary | ICD-10-CM | POA: Diagnosis not present
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
## 2021-07-14 ENCOUNTER — Other Ambulatory Visit (HOSPITAL_BASED_OUTPATIENT_CLINIC_OR_DEPARTMENT_OTHER): Payer: Self-pay | Admitting: Plastic Surgery

## 2021-07-14 DIAGNOSIS — Z1231 Encounter for screening mammogram for malignant neoplasm of breast: Secondary | ICD-10-CM

## 2021-07-16 ENCOUNTER — Other Ambulatory Visit: Payer: Self-pay

## 2021-07-16 ENCOUNTER — Ambulatory Visit (INDEPENDENT_AMBULATORY_CARE_PROVIDER_SITE_OTHER): Payer: Medicare Other

## 2021-07-16 DIAGNOSIS — Z1231 Encounter for screening mammogram for malignant neoplasm of breast: Secondary | ICD-10-CM

## 2021-07-29 ENCOUNTER — Other Ambulatory Visit: Payer: Self-pay

## 2021-07-29 DIAGNOSIS — S73191D Other sprain of right hip, subsequent encounter: Secondary | ICD-10-CM

## 2021-07-30 MED ORDER — TRAMADOL HCL 50 MG PO TABS: 100.0000 mg | ORAL_TABLET | Freq: Two times a day (BID) | ORAL | 0 refills | Status: DC | PRN

## 2021-08-11 ENCOUNTER — Other Ambulatory Visit (HOSPITAL_COMMUNITY): Payer: Self-pay | Admitting: Psychiatry

## 2021-08-13 ENCOUNTER — Other Ambulatory Visit: Payer: Self-pay

## 2021-08-13 ENCOUNTER — Ambulatory Visit (INDEPENDENT_AMBULATORY_CARE_PROVIDER_SITE_OTHER): Payer: Medicare Other | Admitting: Physical Therapy

## 2021-08-13 ENCOUNTER — Encounter: Payer: Self-pay | Admitting: Physical Therapy

## 2021-08-13 DIAGNOSIS — G8929 Other chronic pain: Secondary | ICD-10-CM | POA: Diagnosis not present

## 2021-08-13 DIAGNOSIS — M545 Low back pain, unspecified: Secondary | ICD-10-CM

## 2021-08-13 DIAGNOSIS — M546 Pain in thoracic spine: Secondary | ICD-10-CM | POA: Diagnosis not present

## 2021-08-13 DIAGNOSIS — M6281 Muscle weakness (generalized): Secondary | ICD-10-CM

## 2021-08-13 DIAGNOSIS — R2689 Other abnormalities of gait and mobility: Secondary | ICD-10-CM | POA: Diagnosis not present

## 2021-08-13 DIAGNOSIS — M542 Cervicalgia: Secondary | ICD-10-CM | POA: Diagnosis not present

## 2021-08-13 DIAGNOSIS — R293 Abnormal posture: Secondary | ICD-10-CM | POA: Diagnosis not present
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
## 2021-08-13 NOTE — Patient Instructions (Signed)

## 2021-08-17 ENCOUNTER — Other Ambulatory Visit: Payer: Self-pay

## 2021-08-17 ENCOUNTER — Ambulatory Visit (INDEPENDENT_AMBULATORY_CARE_PROVIDER_SITE_OTHER): Payer: Medicare Other | Admitting: Physical Therapy

## 2021-08-17 DIAGNOSIS — M545 Low back pain, unspecified: Secondary | ICD-10-CM | POA: Diagnosis not present

## 2021-08-17 DIAGNOSIS — M6281 Muscle weakness (generalized): Secondary | ICD-10-CM

## 2021-08-17 DIAGNOSIS — R293 Abnormal posture: Secondary | ICD-10-CM | POA: Diagnosis not present

## 2021-08-17 DIAGNOSIS — R2689 Other abnormalities of gait and mobility: Secondary | ICD-10-CM | POA: Diagnosis not present

## 2021-08-17 DIAGNOSIS — G8929 Other chronic pain: Secondary | ICD-10-CM

## 2021-08-17 DIAGNOSIS — M546 Pain in thoracic spine: Secondary | ICD-10-CM

## 2021-08-17 DIAGNOSIS — M542 Cervicalgia: Secondary | ICD-10-CM | POA: Diagnosis not present
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
## 2021-08-20 ENCOUNTER — Other Ambulatory Visit: Payer: Self-pay

## 2021-08-20 ENCOUNTER — Ambulatory Visit (INDEPENDENT_AMBULATORY_CARE_PROVIDER_SITE_OTHER): Payer: Medicare Other | Admitting: Physical Therapy

## 2021-08-20 DIAGNOSIS — M546 Pain in thoracic spine: Secondary | ICD-10-CM

## 2021-08-20 DIAGNOSIS — M6281 Muscle weakness (generalized): Secondary | ICD-10-CM

## 2021-08-20 DIAGNOSIS — M542 Cervicalgia: Secondary | ICD-10-CM | POA: Diagnosis not present

## 2021-08-20 DIAGNOSIS — R2689 Other abnormalities of gait and mobility: Secondary | ICD-10-CM

## 2021-08-20 DIAGNOSIS — R293 Abnormal posture: Secondary | ICD-10-CM
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
## 2021-08-24 ENCOUNTER — Ambulatory Visit (INDEPENDENT_AMBULATORY_CARE_PROVIDER_SITE_OTHER): Payer: Medicare Other | Admitting: Physical Therapy

## 2021-08-24 ENCOUNTER — Other Ambulatory Visit: Payer: Self-pay

## 2021-08-24 DIAGNOSIS — R293 Abnormal posture: Secondary | ICD-10-CM

## 2021-08-24 DIAGNOSIS — M545 Low back pain, unspecified: Secondary | ICD-10-CM | POA: Diagnosis not present

## 2021-08-24 DIAGNOSIS — R2689 Other abnormalities of gait and mobility: Secondary | ICD-10-CM | POA: Diagnosis not present

## 2021-08-24 DIAGNOSIS — M546 Pain in thoracic spine: Secondary | ICD-10-CM

## 2021-08-24 DIAGNOSIS — M542 Cervicalgia: Secondary | ICD-10-CM

## 2021-08-24 DIAGNOSIS — G8929 Other chronic pain: Secondary | ICD-10-CM | POA: Diagnosis not present

## 2021-08-24 DIAGNOSIS — M6281 Muscle weakness (generalized): Secondary | ICD-10-CM
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
## 2021-08-27 ENCOUNTER — Other Ambulatory Visit: Payer: Self-pay

## 2021-08-27 ENCOUNTER — Ambulatory Visit (INDEPENDENT_AMBULATORY_CARE_PROVIDER_SITE_OTHER): Payer: Medicare Other | Admitting: Physical Therapy

## 2021-08-27 DIAGNOSIS — M6281 Muscle weakness (generalized): Secondary | ICD-10-CM | POA: Diagnosis not present

## 2021-08-27 DIAGNOSIS — M546 Pain in thoracic spine: Secondary | ICD-10-CM

## 2021-08-27 DIAGNOSIS — M542 Cervicalgia: Secondary | ICD-10-CM | POA: Diagnosis not present

## 2021-08-27 DIAGNOSIS — R2689 Other abnormalities of gait and mobility: Secondary | ICD-10-CM

## 2021-08-27 DIAGNOSIS — G8929 Other chronic pain: Secondary | ICD-10-CM | POA: Diagnosis not present

## 2021-08-27 DIAGNOSIS — R293 Abnormal posture: Secondary | ICD-10-CM | POA: Diagnosis not present

## 2021-08-27 DIAGNOSIS — M545 Low back pain, unspecified: Secondary | ICD-10-CM | POA: Diagnosis not present
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
## 2021-08-28 ENCOUNTER — Telehealth: Payer: Medicare Other | Admitting: Physician Assistant

## 2021-08-28 DIAGNOSIS — J019 Acute sinusitis, unspecified: Secondary | ICD-10-CM

## 2021-08-28 DIAGNOSIS — B9789 Other viral agents as the cause of diseases classified elsewhere: Secondary | ICD-10-CM

## 2021-08-28 MED ORDER — ONDANSETRON HCL 4 MG PO TABS: 4.0000 mg | ORAL_TABLET | Freq: Three times a day (TID) | ORAL | 0 refills | Status: DC | PRN

## 2021-08-28 MED ORDER — IPRATROPIUM BROMIDE 0.03 % NA SOLN: 2.0000 | Freq: Two times a day (BID) | NASAL | 0 refills | Status: DC

## 2021-08-28 NOTE — Progress Notes (Signed)

## 2021-08-28 NOTE — Addendum Note (Signed)
Addended by: Mar Daring on: 08/28/2021 07:20 PM   Modules accepted: Orders

## 2021-08-31 ENCOUNTER — Encounter: Payer: Medicare Other | Admitting: Physical Therapy

## 2021-09-03 ENCOUNTER — Encounter: Payer: Medicare Other | Admitting: Physical Therapy

## 2021-09-08 ENCOUNTER — Other Ambulatory Visit (HOSPITAL_COMMUNITY): Payer: Self-pay | Admitting: Psychiatry

## 2021-09-09 ENCOUNTER — Encounter: Payer: Self-pay | Admitting: Physical Therapy

## 2021-09-09 ENCOUNTER — Ambulatory Visit (INDEPENDENT_AMBULATORY_CARE_PROVIDER_SITE_OTHER): Payer: Medicare Other | Admitting: Physical Therapy

## 2021-09-09 ENCOUNTER — Other Ambulatory Visit: Payer: Self-pay

## 2021-09-09 DIAGNOSIS — M6281 Muscle weakness (generalized): Secondary | ICD-10-CM

## 2021-09-09 DIAGNOSIS — M546 Pain in thoracic spine: Secondary | ICD-10-CM | POA: Diagnosis not present

## 2021-09-09 DIAGNOSIS — R293 Abnormal posture: Secondary | ICD-10-CM | POA: Diagnosis not present

## 2021-09-09 DIAGNOSIS — R2689 Other abnormalities of gait and mobility: Secondary | ICD-10-CM

## 2021-09-09 NOTE — Patient Instructions (Signed)
Aquatic Therapy: What to Expect! ° °Where:  °[facility] [address] °[address] °[place], [place] [address] °[phone]          ° °How to Prepare: ° °If you require assistance with dressing, with transportation (ie: wheel chair), or toileting, a caregiver must attend the entire session with you (unless your primary therapists feels this is not necessary).   °If there is thunder during your appointment, you will be asked to leave pool area. You have the option to finish your session in the physical therapy area near the gym. °Masks in the pool area are optional. Your face will remain dry during your session, so you are welcome to keep your mask on, if desired. You will be spaced at least 6 feet from other aquatic patients.  °Please bring your own swim towel to dry off with.   °There are Men's and Women's locker rooms with showers, as well as gender neutral bathrooms in the pool area.  °Please arrive IN YOUR SUIT and a few minutes prior to your appointment - this helps to avoid delays in starting your session. °Head to the pool and await your appointment on the bench on the pool deck. °Please make sure to attend to any toileting needs prior to entering the pool. °Once on the pool deck your therapist will ask you to sign the Patient  Consent and Assignment of Benefits form. °Your therapist may take your blood pressure prior to, during and after your session if indicated. °We usually try and create a home exercise program based on activities we do in the pool. Some patients do not want to or do not have the ability to participate in an aquatic home program - this is not a barrier in any way to you participating in aquatic therapy as part of your current therapy plan! ° °Appointments:  All sessions are 45 minutes ° °About the pool: °Entering the pool: °Your therapist will assist you if needed; there are two ways to enter the pool - stairs or a mechanical lift. Your therapist will determine  the most appropriate way for you. °Water temperature is usually around 91-95°.  There is a lap pool with a temperature around 84° °There may be other therapists and patients in the pool at the same time.  ° °Contact Info:            °To cancel appointment, please call [facility]  at [phone] °If you are running late, please call [facility] at [phone]    °        °     ° ° °
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
## 2021-09-11 ENCOUNTER — Ambulatory Visit (INDEPENDENT_AMBULATORY_CARE_PROVIDER_SITE_OTHER): Payer: Medicare Other | Admitting: Physical Therapy

## 2021-09-11 ENCOUNTER — Other Ambulatory Visit: Payer: Self-pay

## 2021-09-11 DIAGNOSIS — M6281 Muscle weakness (generalized): Secondary | ICD-10-CM | POA: Diagnosis not present

## 2021-09-11 DIAGNOSIS — R293 Abnormal posture: Secondary | ICD-10-CM | POA: Diagnosis not present

## 2021-09-11 DIAGNOSIS — M546 Pain in thoracic spine: Secondary | ICD-10-CM

## 2021-09-11 DIAGNOSIS — M545 Low back pain, unspecified: Secondary | ICD-10-CM | POA: Diagnosis not present

## 2021-09-11 DIAGNOSIS — G8929 Other chronic pain: Secondary | ICD-10-CM | POA: Diagnosis not present

## 2021-09-11 DIAGNOSIS — M542 Cervicalgia: Secondary | ICD-10-CM

## 2021-09-11 DIAGNOSIS — R2689 Other abnormalities of gait and mobility: Secondary | ICD-10-CM | POA: Diagnosis not present
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
## 2021-09-14 ENCOUNTER — Other Ambulatory Visit: Payer: Self-pay | Admitting: Sports Medicine

## 2021-09-14 DIAGNOSIS — S73191D Other sprain of right hip, subsequent encounter: Secondary | ICD-10-CM

## 2021-09-15 ENCOUNTER — Ambulatory Visit (INDEPENDENT_AMBULATORY_CARE_PROVIDER_SITE_OTHER): Payer: Medicare Other | Admitting: Family Medicine

## 2021-09-15 ENCOUNTER — Encounter: Payer: Self-pay | Admitting: Family Medicine

## 2021-09-15 ENCOUNTER — Other Ambulatory Visit: Payer: Self-pay

## 2021-09-15 DIAGNOSIS — F32A Depression, unspecified: Secondary | ICD-10-CM

## 2021-09-15 DIAGNOSIS — F419 Anxiety disorder, unspecified: Secondary | ICD-10-CM | POA: Diagnosis not present

## 2021-09-15 DIAGNOSIS — R635 Abnormal weight gain: Secondary | ICD-10-CM | POA: Diagnosis not present

## 2021-09-15 MED ORDER — PHENTERMINE HCL 15 MG PO CAPS
15.0000 mg | ORAL_CAPSULE | ORAL | 0 refills | Status: DC
Start: 2021-09-15 — End: 2022-04-27
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
## 2021-09-15 NOTE — Assessment & Plan Note (Signed)
She continues to do well with current dose of phentermine.  We will continue at current strength.

## 2021-09-15 NOTE — Assessment & Plan Note (Signed)
Managed by psychiatry.  Stable at this time with current medications.

## 2021-09-16 ENCOUNTER — Encounter: Payer: Self-pay | Admitting: Physical Therapy

## 2021-09-16 ENCOUNTER — Ambulatory Visit: Payer: Medicare Other | Attending: Plastic Surgery | Admitting: Physical Therapy

## 2021-09-16 DIAGNOSIS — M546 Pain in thoracic spine: Secondary | ICD-10-CM | POA: Diagnosis not present

## 2021-09-16 DIAGNOSIS — R2689 Other abnormalities of gait and mobility: Secondary | ICD-10-CM

## 2021-09-16 DIAGNOSIS — M542 Cervicalgia: Secondary | ICD-10-CM | POA: Insufficient documentation

## 2021-09-16 DIAGNOSIS — M6281 Muscle weakness (generalized): Secondary | ICD-10-CM

## 2021-09-16 DIAGNOSIS — R293 Abnormal posture: Secondary | ICD-10-CM
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
## 2021-09-16 NOTE — Patient Instructions (Signed)
Access Code: [facility] URL: [email or web address] Date: 09/16/2021 Prepared by: Lillian  Exercises Standing Shoulder External Rotation with Resistance - 1 x daily - 7 x weekly - 2 sets - 10 reps Standing Shoulder Horizontal Abduction with Resistance - 1 x daily - 7 x weekly - 2 sets - 10 reps Standing Shoulder Row with Anchored Resistance - 1 x daily - 7 x weekly - 2 sets - 10 reps Doorway Pec Stretch at 60 Elevation - 1 x daily - 7 x weekly - 2 sets - 30 sec hold Beginner Bridge - 1 x daily - 7 x weekly - 2 sets - 10 reps Supine Lower Trunk Rotation - 1 x daily - 7 x weekly - 3 sets - 20 sec hold Child's Pose Stretch - 1 x daily - 7 x weekly - 3 sets - 30 sec hold Modified Thomas Stretch - 1 x daily - 7 x weekly - 2 sets - 30 sec hold Seated Upper Trapezius Stretch - 1 x daily - 7 x weekly - 2 sets - 30 sec hold Seated Levator Scapulae Stretch - 1 x daily - 7 x weekly - 2 sets - 10 reps Low Trap Setting at Wall - 1 x daily - 7 x weekly - 2 sets - 10 reps Sidelying TFL Stretch - 1 x daily - 7 x weekly - 2 sets - 30 sec hold Supine March - 1 x daily - 7 x weekly - 2 sets - 10 reps Side Stepping with Resistance at Ankles - 1 x daily - 7 x weekly - 2 sets - 10 reps Hooklying Bent Leg Lifts - 1 x daily - 7 x weekly - 3 sets - 10 sec hold Sit to Stand Without Arm Support - 1 x daily - 7 x weekly - 2 sets - 5 reps Supine PNF D2 Flexion with Resistance - 1 x daily - 7 x weekly - 1 sets - 10 reps

## 2021-09-18 ENCOUNTER — Other Ambulatory Visit: Payer: Self-pay

## 2021-09-18 ENCOUNTER — Ambulatory Visit: Payer: Medicare Other | Admitting: Physical Therapy

## 2021-09-18 DIAGNOSIS — M546 Pain in thoracic spine: Secondary | ICD-10-CM | POA: Diagnosis not present

## 2021-09-18 DIAGNOSIS — R2689 Other abnormalities of gait and mobility: Secondary | ICD-10-CM | POA: Diagnosis not present

## 2021-09-18 DIAGNOSIS — G8929 Other chronic pain: Secondary | ICD-10-CM

## 2021-09-18 DIAGNOSIS — M6281 Muscle weakness (generalized): Secondary | ICD-10-CM

## 2021-09-18 DIAGNOSIS — R293 Abnormal posture: Secondary | ICD-10-CM

## 2021-09-18 DIAGNOSIS — M542 Cervicalgia: Secondary | ICD-10-CM | POA: Diagnosis not present

## 2021-09-18 NOTE — Patient Instructions (Signed)
Access Code: [facility] URL: [email or web address] Date: 09/18/2021 Prepared by: Isabelle Course  Exercises Standing Shoulder External Rotation with Resistance - 1 x daily - 7 x weekly - 2 sets - 10 reps Standing Shoulder Horizontal Abduction with Resistance - 1 x daily - 7 x weekly - 2 sets - 10 reps Standing Shoulder Row with Anchored Resistance - 1 x daily - 7 x weekly - 2 sets - 10 reps Doorway Pec Stretch at 60 Elevation - 1 x daily - 7 x weekly - 2 sets - 30 sec hold Beginner Bridge - 1 x daily - 7 x weekly - 2 sets - 10 reps Supine Lower Trunk Rotation - 1 x daily - 7 x weekly - 3 sets - 20 sec hold Child's Pose Stretch - 1 x daily - 7 x weekly - 3 sets - 30 sec hold Modified Thomas Stretch - 1 x daily - 7 x weekly - 2 sets - 30 sec hold Seated Upper Trapezius Stretch - 1 x daily - 7 x weekly - 2 sets - 30 sec hold Seated Levator Scapulae Stretch - 1 x daily - 7 x weekly - 2 sets - 10 reps Low Trap Setting at [place] - 1 x daily - 7 x weekly - 2 sets - 10 reps Sidelying TFL Stretch - 1 x daily - 7 x weekly - 2 sets - 30 sec hold Supine March - 1 x daily - 7 x weekly - 2 sets - 10 reps Side Stepping with Resistance at Ankles - 1 x daily - 7 x weekly - 2 sets - 10 reps Hooklying Bent Leg Lifts - 1 x daily - 7 x weekly - 3 sets - 10 sec hold Sit to Stand Without Arm Support - 1 x daily - 7 x weekly - 2 sets - 5 reps Supine PNF D2 Flexion with Resistance - 1 x daily - 7 x weekly - 1 sets - 10 reps Supine Chest Stretch on Foam Roll - 1 x daily - 7 x weekly - 1 sets - 3 reps - 30 sec hold Thoracic Foam Roll Mobilization Backstroke - 1 x daily - 7 x weekly - 1 sets - 10 reps
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
## 2021-09-22 ENCOUNTER — Encounter (HOSPITAL_COMMUNITY): Payer: Self-pay | Admitting: Psychiatry

## 2021-09-22 ENCOUNTER — Telehealth (INDEPENDENT_AMBULATORY_CARE_PROVIDER_SITE_OTHER): Payer: Medicare Other | Admitting: Psychiatry

## 2021-09-22 DIAGNOSIS — F411 Generalized anxiety disorder: Secondary | ICD-10-CM | POA: Diagnosis not present

## 2021-09-22 DIAGNOSIS — F332 Major depressive disorder, recurrent severe without psychotic features: Secondary | ICD-10-CM | POA: Diagnosis not present

## 2021-09-22 DIAGNOSIS — F5102 Adjustment insomnia: Secondary | ICD-10-CM
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
## 2021-09-23 ENCOUNTER — Other Ambulatory Visit: Payer: Self-pay

## 2021-09-23 ENCOUNTER — Ambulatory Visit: Payer: Medicare Other | Admitting: Physical Therapy

## 2021-09-23 ENCOUNTER — Encounter: Payer: Self-pay | Admitting: Physical Therapy

## 2021-09-23 DIAGNOSIS — M546 Pain in thoracic spine: Secondary | ICD-10-CM

## 2021-09-23 DIAGNOSIS — M6281 Muscle weakness (generalized): Secondary | ICD-10-CM | POA: Diagnosis not present

## 2021-09-23 DIAGNOSIS — R2689 Other abnormalities of gait and mobility: Secondary | ICD-10-CM | POA: Diagnosis not present

## 2021-09-23 DIAGNOSIS — M542 Cervicalgia: Secondary | ICD-10-CM

## 2021-09-23 DIAGNOSIS — R293 Abnormal posture: Secondary | ICD-10-CM | POA: Diagnosis not present
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
## 2021-09-24 NOTE — Addendum Note (Signed)
Addended by: Malachy Moan L on: 09/24/2021 09:37 AM   Modules accepted: Orders

## 2021-09-25 ENCOUNTER — Other Ambulatory Visit: Payer: Self-pay

## 2021-09-25 ENCOUNTER — Encounter: Payer: Medicare Other | Admitting: Physical Therapy

## 2021-09-25 ENCOUNTER — Ambulatory Visit: Payer: Medicare Other | Admitting: Physical Therapy

## 2021-09-25 DIAGNOSIS — M542 Cervicalgia: Secondary | ICD-10-CM | POA: Diagnosis not present

## 2021-09-25 DIAGNOSIS — R2689 Other abnormalities of gait and mobility: Secondary | ICD-10-CM | POA: Diagnosis not present

## 2021-09-25 DIAGNOSIS — M6281 Muscle weakness (generalized): Secondary | ICD-10-CM

## 2021-09-25 DIAGNOSIS — R293 Abnormal posture: Secondary | ICD-10-CM

## 2021-09-25 DIAGNOSIS — M546 Pain in thoracic spine: Secondary | ICD-10-CM | POA: Diagnosis not present
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
## 2021-09-29 NOTE — Progress Notes (Cosign Needed)
° °  Referring Provider [name], [address] [place],  [place] [address]   CC:  Chief Complaint  Patient presents with   Follow-up      Jessica Lang is an 61 y.o. female.  HPI: Patient is a 61 y.o. year old female here for follow up after completing physical therapy for pain related to macromastia.   Patient was seen for initial consult here in clinic 07/09/2021 by Dr. Claudia Desanctis.  At that time, she complained of chronic back and neck discomfort as well as inframammary rashes in the context of large breasts.  She was noted to be a D cup to be a B cup.  STN 33 cm in the right, 32 cm on the left.  Estimated excess breast tissue to be removed at time of surgery equals 500 g each side.  She was encouraged to participate in physical therapy as well as discontinue nicotine vaping.  Today, patient tells me that she has been nicotine free since 08/22/2021.  She reports that she went from cigarettes to vaping in 2021 but has since completely transitioned away from nicotine altogether after speaking with Dr. Claudia Desanctis.  She is very much looking forward to surgical intervention for her large breasts.  She states that she is on phentermine and has lost 35 pounds in the past year.  She reports that physical therapy helped with hip discomfort, but has not been able to improve her back pain that spans from her cervical spine to lumbar spine.  She also endorses inframammary rashes for which she tries to pat dry regularly with paper towels.  Patient confirms that she would like to be "as small as possible".   Review of Systems General: Endorses back pain as well as intermittent inframammary rashes.  Physical Exam Vitals with BMI 10/01/2021 09/15/2021 07/09/2021  Height - 5\' 4"  5\' 4"   Weight 166 lbs 166 lbs 169 lbs  BMI 28.48 31.49 70.26  Systolic - 378 588  Diastolic - 64 72  Pulse - 86 65  Some encounter information is confidential and restricted. Go to Review Flowsheets activity to  see all data.    General:  No acute distress,  Alert and oriented, Non-Toxic, Normal speech and affect Psych: Normal behavior and mood Chest: Large breasts bilaterally. Pulmonary: No increased work of breathing.  Assessment/Plan  Patient is interested in pursuing surgical intervention for bilateral breast reduction. Patient has completed at least 6 weeks of physical therapy for pain related to macromastia.  Discussed with patient we would submit to insurance for authorization, discussed approval could take up to 6 weeks.   Jessica Lang 10/01/2021, 3:04 PM

## 2021-10-01 ENCOUNTER — Other Ambulatory Visit: Payer: Self-pay

## 2021-10-01 ENCOUNTER — Ambulatory Visit (INDEPENDENT_AMBULATORY_CARE_PROVIDER_SITE_OTHER): Payer: Medicare Other | Admitting: Physician Assistant

## 2021-10-01 VITALS — Wt 166.0 lb

## 2021-10-01 DIAGNOSIS — N62 Hypertrophy of breast: Secondary | ICD-10-CM

## 2021-10-19 ENCOUNTER — Other Ambulatory Visit: Payer: Self-pay

## 2021-10-19 ENCOUNTER — Encounter: Payer: Self-pay | Admitting: Surgical

## 2021-10-19 ENCOUNTER — Ambulatory Visit (INDEPENDENT_AMBULATORY_CARE_PROVIDER_SITE_OTHER): Payer: Medicare Other | Admitting: Surgical

## 2021-10-19 VITALS — BP 133/87 | HR 85 | Ht 64.0 in | Wt 180.6 lb

## 2021-10-19 DIAGNOSIS — M4004 Postural kyphosis, thoracic region: Secondary | ICD-10-CM

## 2021-10-19 DIAGNOSIS — N62 Hypertrophy of breast: Secondary | ICD-10-CM

## 2021-10-19 DIAGNOSIS — M546 Pain in thoracic spine: Secondary | ICD-10-CM

## 2021-10-19 DIAGNOSIS — M545 Low back pain, unspecified: Secondary | ICD-10-CM

## 2021-10-19 DIAGNOSIS — Z720 Tobacco use: Secondary | ICD-10-CM

## 2021-10-19 MED ORDER — OXYCODONE HCL 5 MG PO TABS: 5.0000 mg | ORAL_TABLET | Freq: Four times a day (QID) | ORAL | 0 refills | Status: AC | PRN

## 2021-10-19 MED ORDER — CEPHALEXIN 500 MG PO CAPS: 500.0000 mg | ORAL_CAPSULE | Freq: Four times a day (QID) | ORAL | 0 refills | Status: AC

## 2021-10-19 MED ORDER — ONDANSETRON HCL 4 MG PO TABS: 4.0000 mg | ORAL_TABLET | Freq: Three times a day (TID) | ORAL | 0 refills | Status: DC | PRN
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
## 2021-10-20 ENCOUNTER — Telehealth: Payer: Self-pay

## 2021-10-20 NOTE — Telephone Encounter (Signed)
Patient called she stated she has a question about [facility], patient is requesting a call back at [phone]

## 2021-10-20 NOTE — Telephone Encounter (Signed)
Called and spoke with the patient regarding the message below.  Patient stated she wanted to know if it would make a difference it she went to the [facility] in [place] to have her labs done.  Informed the patient that she can give [facility] a call and ask if it would make a difference.    Informed the patient that maybe the [facility] in [place] would do the labs and fax the results to [place].  Patient stated that she will give [facility] a call.//AB/CMA

## 2021-10-20 NOTE — Telephone Encounter (Signed)
Faxed surgical clearance to [place], DO

## 2021-10-23 DIAGNOSIS — Z720 Tobacco use: Secondary | ICD-10-CM | POA: Diagnosis not present

## 2021-10-26 ENCOUNTER — Other Ambulatory Visit (HOSPITAL_COMMUNITY): Payer: Self-pay | Admitting: Psychiatry

## 2021-10-26 ENCOUNTER — Encounter: Payer: Self-pay | Admitting: Physician Assistant

## 2021-10-26 NOTE — Progress Notes (Signed)
Medical clearance received by patient's primary care provider, Dr. Zigmund Daniel.  Recommendations are to hold phentermine day of surgery, may resume 1 day postop.

## 2021-10-27 ENCOUNTER — Telehealth: Payer: Self-pay | Admitting: Cardiology

## 2021-10-27 NOTE — Telephone Encounter (Signed)
Spoke to patient she stated she received a call she is due to see Dr.Crenshaw.Stated she cannot schedule at this time.Stated she wanted Dr.Crenshaw to know she has been taking Phentermine 15 mg daily for weight loss,since she has been taking she has not had any slow heart beat.I will make him aware.

## 2021-10-27 NOTE — Telephone Encounter (Signed)
Pt c/o medication issue:  1. Name of Medication: Phentermine 15mg   2. How are you currently taking this medication (dosage and times per day)? 1x daily  3. Are you having a reaction (difficulty breathing--STAT)? no  4. What is your medication issue? Patient states that ever since she has started taking this medication for weight loss she has had no symptoms of bradycardia. She wanted me to pass this along to Dr. Stanford Breed. She stated, "maybe this shows that I do have an issue with my heart and it is not all in my head"

## 2021-11-03 LAB — NICOTINE/COTININE METABOLITES
Cotinine: <1 ng/mL
Nicotine: <1 ng/mL

## 2021-11-04 ENCOUNTER — Encounter (HOSPITAL_COMMUNITY): Payer: Self-pay | Admitting: Plastic Surgery

## 2021-11-04 ENCOUNTER — Other Ambulatory Visit: Payer: Self-pay

## 2021-11-04 NOTE — Progress Notes (Signed)
PCP - Dr. Gilda Crease  Cardiologist - Dr. Stanford Breed  EP- Denies  Endocrine- Denies  Pulm- Denies  Chest x-ray - Denies  EKG - 01/14/21 (E)  Stress Test -03/28/18 (E)  ECHO - 03/28/18 (E)  Cardiac Cath - Denies  AICD- na PM- na LOOP- na  Nerve Stimulator- Denies  Dialysis- Denies  Sleep Study - Denies CPAP - Denies  LABS- 11/06/21: CBC  ASA- Denies  ERAS- No  HA1C- Denies  Anesthesia- No  Pt denies having chest pain, sob, or fever during the pre-op phone call. All instructions explained to the pt, with a verbal understanding of the material including: as of today,  stop taking all Aspirin (unless instructed by your doctor) and Other Aspirin containing products, Vitamins, Fish oils, and Herbal medications. Also stop all NSAIDS i.e. Advil, Ibuprofen, Motrin, Aleve, Anaprox, Naproxen, BC, Goody Powders, and all Supplements.  Pt also instructed to wear a mask and social distance if she goes out. The opportunity to ask questions was provided.    Coronavirus Screening  Have you experienced the following symptoms:  Cough yes/no: No Fever (>100.11F)  yes/no: No Runny nose yes/no: No Sore throat yes/no: No Difficulty breathing/shortness of breath  yes/no: No  Have you or a family member traveled in the last 14 days and where? yes/no: No   If the patient indicates "YES" to the above questions, their PAT will be rescheduled to limit the exposure to others and, the surgeon will be notified. THE PATIENT WILL NEED TO BE ASYMPTOMATIC FOR 14 DAYS.   If the patient is not experiencing any of these symptoms, the PAT nurse will instruct them to NOT bring anyone with them to their appointment since they may have these symptoms or traveled as well.   Please remind your patients and families that hospital visitation restrictions are in effect and the importance of the restrictions.

## 2021-11-05 NOTE — Anesthesia Preprocedure Evaluation (Addendum)
Anesthesia Evaluation  Patient identified by MRN, date of birth, ID band Patient awake    Reviewed: Allergy & Precautions, NPO status , Patient's Chart, lab work & pertinent test results  History of Anesthesia Complications (+) history of anesthetic complications (dislocated jaw in 1990s per pt)  Airway Mallampati: III  TM Distance: >3 FB Neck ROM: Full    Dental  (+) Teeth Intact, Dental Advisory Given   Pulmonary COPD, former smoker,  Quit smoking 2021, 40 pack year history No inhalers   Pulmonary exam normal breath sounds clear to auscultation       Cardiovascular negative cardio ROS Normal cardiovascular exam Rhythm:Regular Rate:Normal     Neuro/Psych PSYCHIATRIC DISORDERS Anxiety Depression negative neurological ROS     GI/Hepatic Neg liver ROS, Chronic nausea- takes zofran    Endo/Other  negative endocrine ROS  Renal/GU negative Renal ROS  negative genitourinary   Musculoskeletal negative musculoskeletal ROS (+)   Abdominal   Peds  Hematology negative hematology ROS (+)   Anesthesia Other Findings Macromastia    Reproductive/Obstetrics negative OB ROS                           Anesthesia Physical Anesthesia Plan  ASA: 3  Anesthesia Plan: General   Post-op Pain Management: Tylenol PO (pre-op)*, Toradol IV (intra-op)* and Dilaudid IV   Induction: Intravenous  PONV Risk Score and Plan: 3 and Ondansetron, Dexamethasone, Midazolam, Treatment may vary due to age or medical condition, Scopolamine patch - Pre-op, Diphenhydramine and Metaclopromide  Airway Management Planned: Oral ETT and Video Laryngoscope Planned  Additional Equipment: None  Intra-op Plan:   Post-operative Plan: Extubation in OR  Informed Consent: I have reviewed the patients History and Physical, chart, labs and discussed the procedure including the risks, benefits and alternatives for the proposed  anesthesia with the patient or authorized representative who has indicated his/her understanding and acceptance.     Dental advisory given  Plan Discussed with: CRNA  Anesthesia Plan Comments:        Anesthesia Quick Evaluation

## 2021-11-06 ENCOUNTER — Encounter (HOSPITAL_COMMUNITY)
Admission: RE | Disposition: A | Discharge status: Home / Self Care | Payer: Self-pay | Source: Home / Self Care | Attending: Plastic Surgery

## 2021-11-06 ENCOUNTER — Other Ambulatory Visit: Payer: Self-pay

## 2021-11-06 ENCOUNTER — Ambulatory Visit (HOSPITAL_COMMUNITY): Payer: Medicare Other | Admitting: Anesthesiology

## 2021-11-06 ENCOUNTER — Ambulatory Visit (HOSPITAL_COMMUNITY)
Admission: RE | Admit: 2021-11-06 | Discharge: 2021-11-06 | Disposition: A | Discharge status: Home / Self Care | Payer: Medicare Other | Attending: Plastic Surgery | Admitting: Plastic Surgery

## 2021-11-06 ENCOUNTER — Encounter (HOSPITAL_COMMUNITY): Payer: Self-pay | Admitting: Plastic Surgery

## 2021-11-06 ENCOUNTER — Ambulatory Visit (HOSPITAL_BASED_OUTPATIENT_CLINIC_OR_DEPARTMENT_OTHER): Payer: Medicare Other | Admitting: Anesthesiology

## 2021-11-06 DIAGNOSIS — F418 Other specified anxiety disorders: Secondary | ICD-10-CM

## 2021-11-06 DIAGNOSIS — Z87891 Personal history of nicotine dependence: Secondary | ICD-10-CM | POA: Insufficient documentation

## 2021-11-06 DIAGNOSIS — M545 Low back pain, unspecified: Secondary | ICD-10-CM | POA: Insufficient documentation

## 2021-11-06 DIAGNOSIS — F32A Depression, unspecified: Secondary | ICD-10-CM | POA: Insufficient documentation

## 2021-11-06 DIAGNOSIS — M4004 Postural kyphosis, thoracic region: Secondary | ICD-10-CM | POA: Diagnosis not present

## 2021-11-06 DIAGNOSIS — F419 Anxiety disorder, unspecified: Secondary | ICD-10-CM | POA: Diagnosis not present

## 2021-11-06 DIAGNOSIS — F4310 Post-traumatic stress disorder, unspecified: Secondary | ICD-10-CM | POA: Insufficient documentation

## 2021-11-06 DIAGNOSIS — N62 Hypertrophy of breast: Secondary | ICD-10-CM | POA: Diagnosis not present

## 2021-11-06 DIAGNOSIS — R110 Nausea: Secondary | ICD-10-CM | POA: Diagnosis not present

## 2021-11-06 DIAGNOSIS — M546 Pain in thoracic spine: Secondary | ICD-10-CM | POA: Insufficient documentation

## 2021-11-06 DIAGNOSIS — J449 Chronic obstructive pulmonary disease, unspecified: Secondary | ICD-10-CM | POA: Diagnosis not present

## 2021-11-06 DIAGNOSIS — Z79899 Other long term (current) drug therapy: Secondary | ICD-10-CM | POA: Insufficient documentation

## 2021-11-06 HISTORY — DX: Other complications of anesthesia, initial encounter: T88.59XA

## 2021-11-06 HISTORY — PX: BREAST REDUCTION SURGERY: SHX8

## 2021-11-06 LAB — CBC
HCT: 41.1 % (ref 36.0–46.0)
Hemoglobin: 13.7 g/dL (ref 12.0–15.0)
MCH: 32.1 pg (ref 26.0–34.0)
MCHC: 33.3 g/dL (ref 30.0–36.0)
MCV: 96.3 fL (ref 80.0–100.0)
Platelets: 175 10*3/uL (ref 150–400)
RBC: 4.27 MIL/uL (ref 3.87–5.11)
RDW: 13.3 % (ref 11.5–15.5)
WBC: 6.7 10*3/uL (ref 4.0–10.5)
nRBC: 0 % (ref 0.0–0.2)

## 2021-11-06 SURGERY — MAMMOPLASTY, REDUCTION
Anesthesia: General | Site: Breast | Laterality: Bilateral

## 2021-11-06 MED ORDER — ALBUTEROL SULFATE HFA 108 (90 BASE) MCG/ACT IN AERS
INHALATION_SPRAY | RESPIRATORY_TRACT | Status: AC
  Filled 2021-11-06: qty 6.7

## 2021-11-06 MED ORDER — HYDROMORPHONE HCL 1 MG/ML IJ SOLN
INTRAMUSCULAR | Status: AC
  Filled 2021-11-06: qty 1

## 2021-11-06 MED ORDER — PROPOFOL 10 MG/ML IV BOLUS
INTRAVENOUS | Status: DC | PRN
  Administered 2021-11-06: 200 mg via INTRAVENOUS

## 2021-11-06 MED ORDER — KETAMINE HCL 10 MG/ML IJ SOLN
INTRAMUSCULAR | Status: DC | PRN
  Administered 2021-11-06: 40 mg via INTRAVENOUS

## 2021-11-06 MED ORDER — SCOPOLAMINE 1 MG/3DAYS TD PT72
MEDICATED_PATCH | TRANSDERMAL | Status: DC | PRN
  Administered 2021-11-06: 1 via TRANSDERMAL

## 2021-11-06 MED ORDER — CEFAZOLIN SODIUM-DEXTROSE 2-4 GM/100ML-% IV SOLN
2.0000 g | INTRAVENOUS | Status: AC
  Administered 2021-11-06: 2 g via INTRAVENOUS

## 2021-11-06 MED ORDER — PHENYLEPHRINE 40 MCG/ML (10ML) SYRINGE FOR IV PUSH (FOR BLOOD PRESSURE SUPPORT)
PREFILLED_SYRINGE | INTRAVENOUS | Status: DC | PRN
  Administered 2021-11-06 (×5): 80 ug via INTRAVENOUS

## 2021-11-06 MED ORDER — METOCLOPRAMIDE HCL 5 MG/ML IJ SOLN
INTRAMUSCULAR | Status: DC | PRN
  Administered 2021-11-06: 10 mg via INTRAVENOUS

## 2021-11-06 MED ORDER — FENTANYL CITRATE (PF) 250 MCG/5ML IJ SOLN
INTRAMUSCULAR | Status: AC
  Filled 2021-11-06: qty 5

## 2021-11-06 MED ORDER — METOCLOPRAMIDE HCL 5 MG/ML IJ SOLN
INTRAMUSCULAR | Status: AC
  Filled 2021-11-06: qty 2

## 2021-11-06 MED ORDER — LACTATED RINGERS IV SOLN: INTRAVENOUS | Status: DC

## 2021-11-06 MED ORDER — FENTANYL CITRATE (PF) 250 MCG/5ML IJ SOLN
INTRAMUSCULAR | Status: DC | PRN
  Administered 2021-11-06: 100 ug via INTRAVENOUS

## 2021-11-06 MED ORDER — SODIUM BICARBONATE 4.2 % IV SOLN
Freq: Once | INTRAVENOUS | Status: AC
  Administered 2021-11-06: 1000 mL via INTRAMUSCULAR
  Filled 2021-11-06: qty 20
  Filled 2021-11-06: qty 50

## 2021-11-06 MED ORDER — CEFAZOLIN SODIUM 1 G IJ SOLR
INTRAMUSCULAR | Status: AC
  Filled 2021-11-06: qty 20

## 2021-11-06 MED ORDER — OXYCODONE HCL 5 MG/5ML PO SOLN: 5.0000 mg | Freq: Once | ORAL | Status: AC | PRN

## 2021-11-06 MED ORDER — KETOROLAC TROMETHAMINE 30 MG/ML IJ SOLN
INTRAMUSCULAR | Status: AC
  Filled 2021-11-06: qty 1

## 2021-11-06 MED ORDER — HYDROMORPHONE HCL 1 MG/ML IJ SOLN
INTRAMUSCULAR | Status: AC
  Filled 2021-11-06: qty 0.5

## 2021-11-06 MED ORDER — DEXMEDETOMIDINE (PRECEDEX) IN NS 20 MCG/5ML (4 MCG/ML) IV SYRINGE
PREFILLED_SYRINGE | INTRAVENOUS | Status: AC
  Filled 2021-11-06: qty 5

## 2021-11-06 MED ORDER — LIDOCAINE 2% (20 MG/ML) 5 ML SYRINGE
INTRAMUSCULAR | Status: AC
  Filled 2021-11-06: qty 5

## 2021-11-06 MED ORDER — ONDANSETRON HCL 4 MG/2ML IJ SOLN: 4.0000 mg | Freq: Once | INTRAMUSCULAR | Status: DC | PRN

## 2021-11-06 MED ORDER — ONDANSETRON HCL 4 MG/2ML IJ SOLN
INTRAMUSCULAR | Status: DC | PRN
  Administered 2021-11-06: 4 mg via INTRAVENOUS

## 2021-11-06 MED ORDER — PHENYLEPHRINE HCL-NACL 20-0.9 MG/250ML-% IV SOLN
INTRAVENOUS | Status: DC | PRN
  Administered 2021-11-06: 30 ug/min via INTRAVENOUS

## 2021-11-06 MED ORDER — ATROPINE SULFATE 0.4 MG/ML IV SOLN
INTRAVENOUS | Status: AC
  Filled 2021-11-06: qty 1

## 2021-11-06 MED ORDER — PHENYLEPHRINE HCL (PRESSORS) 10 MG/ML IV SOLN: INTRAVENOUS | Status: DC | PRN

## 2021-11-06 MED ORDER — PHENYLEPHRINE 40 MCG/ML (10ML) SYRINGE FOR IV PUSH (FOR BLOOD PRESSURE SUPPORT)
PREFILLED_SYRINGE | INTRAVENOUS | Status: AC
  Filled 2021-11-06: qty 10

## 2021-11-06 MED ORDER — DEXAMETHASONE SODIUM PHOSPHATE 10 MG/ML IJ SOLN
INTRAMUSCULAR | Status: DC | PRN
  Administered 2021-11-06: 5 mg via INTRAVENOUS

## 2021-11-06 MED ORDER — HYDROMORPHONE HCL 1 MG/ML IJ SOLN
INTRAMUSCULAR | Status: DC | PRN
Start: 2021-11-06 — End: 2021-11-06
  Administered 2021-11-06: 1 mg via INTRAVENOUS

## 2021-11-06 MED ORDER — DEXMEDETOMIDINE (PRECEDEX) IN NS 20 MCG/5ML (4 MCG/ML) IV SYRINGE
PREFILLED_SYRINGE | INTRAVENOUS | Status: DC | PRN
  Administered 2021-11-06: 12 ug via INTRAVENOUS
  Administered 2021-11-06: 20 ug via INTRAVENOUS

## 2021-11-06 MED ORDER — AMISULPRIDE (ANTIEMETIC) 5 MG/2ML IV SOLN: 10.0000 mg | Freq: Once | INTRAVENOUS | Status: DC | PRN

## 2021-11-06 MED ORDER — ROCURONIUM BROMIDE 10 MG/ML (PF) SYRINGE
PREFILLED_SYRINGE | INTRAVENOUS | Status: AC
  Filled 2021-11-06: qty 10

## 2021-11-06 MED ORDER — ORAL CARE MOUTH RINSE: 15.0000 mL | Freq: Once | OROMUCOSAL | Status: AC

## 2021-11-06 MED ORDER — LIDOCAINE 2% (20 MG/ML) 5 ML SYRINGE
INTRAMUSCULAR | Status: DC | PRN
  Administered 2021-11-06: 60 mg via INTRAVENOUS

## 2021-11-06 MED ORDER — SCOPOLAMINE 1 MG/3DAYS TD PT72
MEDICATED_PATCH | TRANSDERMAL | Status: AC
  Filled 2021-11-06: qty 1

## 2021-11-06 MED ORDER — HYDROMORPHONE HCL 1 MG/ML IJ SOLN
0.2500 mg | INTRAMUSCULAR | Status: DC | PRN
  Administered 2021-11-06: 0.25 mg via INTRAVENOUS

## 2021-11-06 MED ORDER — SUGAMMADEX SODIUM 200 MG/2ML IV SOLN
INTRAVENOUS | Status: DC | PRN
Start: 2021-11-06 — End: 2021-11-06
  Administered 2021-11-06: 200 mg via INTRAVENOUS

## 2021-11-06 MED ORDER — DIPHENHYDRAMINE HCL 50 MG/ML IJ SOLN
INTRAMUSCULAR | Status: DC | PRN
  Administered 2021-11-06: 12.5 mg via INTRAVENOUS

## 2021-11-06 MED ORDER — DIPHENHYDRAMINE HCL 50 MG/ML IJ SOLN
INTRAMUSCULAR | Status: AC
  Filled 2021-11-06: qty 1

## 2021-11-06 MED ORDER — ACETAMINOPHEN 500 MG PO TABS
1000.0000 mg | ORAL_TABLET | Freq: Once | ORAL | Status: AC
  Administered 2021-11-06: 1000 mg via ORAL
  Filled 2021-11-06: qty 2

## 2021-11-06 MED ORDER — SUCCINYLCHOLINE CHLORIDE 200 MG/10ML IV SOSY
PREFILLED_SYRINGE | INTRAVENOUS | Status: AC
  Filled 2021-11-06: qty 10

## 2021-11-06 MED ORDER — ROCURONIUM BROMIDE 10 MG/ML (PF) SYRINGE
PREFILLED_SYRINGE | INTRAVENOUS | Status: DC | PRN
  Administered 2021-11-06: 100 mg via INTRAVENOUS

## 2021-11-06 MED ORDER — KETAMINE HCL 50 MG/5ML IJ SOSY
PREFILLED_SYRINGE | INTRAMUSCULAR | Status: AC
  Filled 2021-11-06: qty 5

## 2021-11-06 MED ORDER — CHLORHEXIDINE GLUCONATE 0.12 % MT SOLN
15.0000 mL | Freq: Once | OROMUCOSAL | Status: AC
  Administered 2021-11-06: 15 mL via OROMUCOSAL
  Filled 2021-11-06: qty 15

## 2021-11-06 MED ORDER — DEXAMETHASONE SODIUM PHOSPHATE 10 MG/ML IJ SOLN
INTRAMUSCULAR | Status: AC
  Filled 2021-11-06: qty 1

## 2021-11-06 MED ORDER — OXYCODONE HCL 5 MG PO TABS
ORAL_TABLET | ORAL | Status: AC
  Filled 2021-11-06: qty 1

## 2021-11-06 MED ORDER — PROPOFOL 10 MG/ML IV BOLUS
INTRAVENOUS | Status: AC
  Filled 2021-11-06: qty 20

## 2021-11-06 MED ORDER — ONDANSETRON HCL 4 MG/2ML IJ SOLN
INTRAMUSCULAR | Status: AC
  Filled 2021-11-06: qty 2

## 2021-11-06 MED ORDER — KETOROLAC TROMETHAMINE 30 MG/ML IJ SOLN
INTRAMUSCULAR | Status: DC | PRN
  Administered 2021-11-06: 30 mg via INTRAVENOUS

## 2021-11-06 MED ORDER — OXYCODONE HCL 5 MG PO TABS
5.0000 mg | ORAL_TABLET | Freq: Once | ORAL | Status: AC | PRN
  Administered 2021-11-06: 5 mg via ORAL

## 2021-11-06 MED ORDER — 0.9 % SODIUM CHLORIDE (POUR BTL) OPTIME
TOPICAL | Status: DC | PRN
  Administered 2021-11-06: 1000 mL

## 2021-11-06 MED ORDER — MIDAZOLAM HCL 2 MG/2ML IJ SOLN
INTRAMUSCULAR | Status: AC
  Filled 2021-11-06: qty 2

## 2021-11-06 SURGICAL SUPPLY — 50 items
BAG COUNTER SPONGE SURGICOUNT (BAG) ×1 IMPLANT
BAG DECANTER FOR FLEXI CONT (MISCELLANEOUS) ×2 IMPLANT
BENZOIN TINCTURE PRP APPL 2/3 (GAUZE/BANDAGES/DRESSINGS) ×3 IMPLANT
BLADE SURG 10 STRL SS (BLADE) ×2 IMPLANT
BLADE SURG 15 STRL LF DISP TIS (BLADE) ×1 IMPLANT
BLADE SURG 15 STRL SS (BLADE) ×2
BNDG ELASTIC 6X10 VLCR STRL LF (GAUZE/BANDAGES/DRESSINGS) ×1 IMPLANT
BNDG ELASTIC 6X5.8 VLCR STR LF (GAUZE/BANDAGES/DRESSINGS) ×4 IMPLANT
BNDG GAUZE ELAST 4 BULKY (GAUZE/BANDAGES/DRESSINGS) ×2 IMPLANT
CHLORAPREP W/TINT 26 (MISCELLANEOUS) ×4 IMPLANT
DRAPE LAPAROSCOPIC ABDOMINAL (DRAPES) ×2 IMPLANT
DRAPE UTILITY XL STRL (DRAPES) ×2 IMPLANT
DRSG PAD ABDOMINAL 8X10 ST (GAUZE/BANDAGES/DRESSINGS) ×4 IMPLANT
ELECT REM PT RETURN 9FT ADLT (ELECTROSURGICAL) ×2
ELECTRODE REM PT RTRN 9FT ADLT (ELECTROSURGICAL) ×1 IMPLANT
GAUZE SPONGE 4X4 12PLY STRL (GAUZE/BANDAGES/DRESSINGS) ×4 IMPLANT
GAUZE SPONGE 4X4 12PLY STRL LF (GAUZE/BANDAGES/DRESSINGS) ×2 IMPLANT
GAUZE XEROFORM 5X9 LF (GAUZE/BANDAGES/DRESSINGS) ×1 IMPLANT
GLOVE SRG 8 PF TXTR STRL LF DI (GLOVE) IMPLANT
GLOVE SURG ENC MOIS LTX SZ6.5 (GLOVE) IMPLANT
GLOVE SURG ENC TEXT LTX SZ7.5 (GLOVE) ×2 IMPLANT
GLOVE SURG LTX SZ6.5 (GLOVE) IMPLANT
GLOVE SURG UNDER POLY LF SZ8 (GLOVE)
GOWN STRL REUS W/ TWL LRG LVL3 (GOWN DISPOSABLE) ×2 IMPLANT
GOWN STRL REUS W/TWL LRG LVL3 (GOWN DISPOSABLE) ×4
KIT BASIN OR (CUSTOM PROCEDURE TRAY) ×2 IMPLANT
MARKER SKIN DUAL TIP RULER LAB (MISCELLANEOUS) IMPLANT
NDL SPNL 18GX3.5 QUINCKE PK (NEEDLE) ×1 IMPLANT
NEEDLE SPNL 18GX3.5 QUINCKE PK (NEEDLE) ×2 IMPLANT
NS IRRIG 1000ML POUR BTL (IV SOLUTION) ×2 IMPLANT
PACK GENERAL/GYN (CUSTOM PROCEDURE TRAY) ×2 IMPLANT
PENCIL SMOKE EVACUATOR (MISCELLANEOUS) ×2 IMPLANT
PIN SAFETY STERILE (MISCELLANEOUS) IMPLANT
SHEET MEDIUM DRAPE 40X70 STRL (DRAPES) IMPLANT
SLEEVE SCD COMPRESS KNEE MED (STOCKING) IMPLANT
SPONGE T-LAP 18X18 ~~LOC~~+RFID (SPONGE) ×5 IMPLANT
STAPLER INSORB 30 2030 C-SECTI (MISCELLANEOUS) ×2 IMPLANT
STAPLER VISISTAT 35W (STAPLE) ×2 IMPLANT
STRIP CLOSURE SKIN 1/2X4 (GAUZE/BANDAGES/DRESSINGS) ×5 IMPLANT
SUT CHROMIC 4 0 PS 2 18 (SUTURE) ×4 IMPLANT
SUT ETHILON 2 0 FS 18 (SUTURE) IMPLANT
SUT ETHILON 3 0 PS 1 (SUTURE) ×4 IMPLANT
SUT PDS AB 2-0 CT2 27 (SUTURE) IMPLANT
SUT VIC AB 3-0 PS1 18 (SUTURE) ×4
SUT VIC AB 3-0 PS1 18XBRD (SUTURE) ×2 IMPLANT
SUT VLOC 90 P-14 23 (SUTURE) ×5 IMPLANT
SYR 50ML LL SCALE MARK (SYRINGE) ×4 IMPLANT
TOWEL GREEN STERILE FF (TOWEL DISPOSABLE) ×4 IMPLANT
TUBING INFILTRATION IT-10001 (TUBING) ×1 IMPLANT
UNDERPAD 30X36 HEAVY ABSORB (UNDERPADS AND DIAPERS) ×4 IMPLANT

## 2021-11-06 NOTE — Transfer of Care (Signed)
Immediate Anesthesia Transfer of Care Note  Patient: Jessica Lang  Procedure(s) Performed: MAMMARY REDUCTION  (BREAST) (Bilateral: Breast)  Patient Location: PACU  Anesthesia Type:General  Level of Consciousness: drowsy, patient cooperative and responds to stimulation  Airway & Oxygen Therapy: Patient Spontanous Breathing  Post-op Assessment: Report given to RN and Post -op Vital signs reviewed and stable  Post vital signs: Reviewed and stable  Last Vitals:  Vitals Value Taken Time  BP 100/66 11/06/21 1111  Temp    Pulse 63 11/06/21 1111  Resp 17 11/06/21 1111  SpO2 99 % 11/06/21 1111  Vitals shown include unvalidated device data.  Last Pain:  Vitals:   11/06/21 0723  TempSrc:   PainSc: 6       Patients Stated Pain Goal: 2 (07/46/00 2984)  Complications: No notable events documented.

## 2021-11-06 NOTE — Anesthesia Procedure Notes (Signed)
Procedure Name: Intubation Date/Time: 11/06/2021 9:15 AM Performed by: Janace Litten, CRNA Pre-anesthesia Checklist: Patient identified, Emergency Drugs available, Suction available and Patient being monitored Patient Re-evaluated:Patient Re-evaluated prior to induction Oxygen Delivery Method: Circle System Utilized Preoxygenation: Pre-oxygenation with 100% oxygen Induction Type: IV induction Ventilation: Mask ventilation without difficulty Laryngoscope Size: Glidescope and 3 Grade View: Grade I Tube type: Oral Tube size: 7.0 mm Number of attempts: 1 Airway Equipment and Method: Stylet Placement Confirmation: ETT inserted through vocal cords under direct vision, positive ETCO2 and breath sounds checked- equal and bilateral Secured at: 21 cm Tube secured with: Tape Dental Injury: Teeth and Oropharynx as per pre-operative assessment  Difficulty Due To: Difficulty was anticipated Comments: Elective glidescope due to patient's previous "anesthetic complication," VLx1 with Glidescope 3, grade I view, atraumatic intubation, +ETCO2, BBS=, VSS.

## 2021-11-06 NOTE — Op Note (Signed)
Operative Note   DATE OF OPERATION: 11/06/2021  LOCATION: [facility]   SURGICAL DEPARTMENT: Plastic Surgery  PREOPERATIVE DIAGNOSES: Bilateral symptomatic macromastia.  POSTOPERATIVE DIAGNOSES:  same  PROCEDURE: Bilateral breast reduction with superomedial pedicle.  SURGEON: Talmadge Coventry, MD  ASSISTANT: Krista Blue, PA The advanced practice practitioner (APP) assisted throughout the case.  The APP was essential in retraction and counter traction when needed to make the case progress smoothly.  This retraction and assistance made it possible to see the tissue planes for the procedure.  The assistance was needed for hemostasis, tissue re-approximation and closure of the incision site.   ANESTHESIA: General.  COMPLICATIONS: None.   INDICATIONS FOR PROCEDURE:  The patient, Jessica Lang is a 61 y.o. female born on 1961/04/28, is here for treatment of bilateral symptomatic macromastia. MRN: [id number]  CONSENT:  Informed consent was obtained directly from the patient. Risks, benefits and alternatives were fully discussed. Specific risks including but not limited to bleeding, infection, hematoma, seroma, scarring, pain, infection, contracture, asymmetry, wound healing problems, and need for further surgery were all discussed. The patient did have an ample opportunity to have questions answered to satisfaction.   DESCRIPTION OF PROCEDURE:  The patient was marked preoperatively for a Wise pattern skin excision.  The patient was taken to the operating room. SCDs were placed and antibiotics were given. General anesthesia was administered.The patient's operative site was prepped and draped in a sterile fashion. A time out was performed and all information was confirmed to be correct.  Right Breast: The breast was infiltrated with tumescent solution to help with hemostasis.  The nipple was marked with a cookie cutter.  A superomedial pedicle was drawn out with the  base of at least 8 cm in size.  A breast tourniquet was then applied and the pedicle was de-epithelialized.  Breast tourniquet was then let down and all incisions were made with a 10 blade.  The pedicle was then isolated down to the chest wall with cautery and the excision was performed removing tissue primarily inferiorly and laterally.  Hemostasis was obtained and the wound was stapled closed.  Left breast:  The breast was infiltrated with tumescent solution to help with hemostasis.  The nipple was marked with a cookie cutter.  A superomedial pedicle was drawn out with the base of at least 8 cm in size.  A breast tourniquet was then applied and the pedicle was de-epithelialized.  Breast tourniquet was then let down and all incisions were made with a 10 blade.  The pedicle was then isolated down to the chest wall with cautery and the excision was performed removing tissue primarily inferiorly and laterally.  Hemostasis was obtained and the wound was stapled closed.  Patient was then set up to check for size and symmetry.  Minor modifications were made.  This resulted in a total of 673g removed from the right side and 641g removed from the left side.  The inframammary incision was closed with a combination of buried in-sorb staples and a running 3-0 Quill suture.  The vertical and periareolar limbs were closed with interrupted buried 4-0 Monocryl and a running 4-0 Quill suture.  Steri-Strips were then applied along with a soft dressing and Ace wrap.  The patient tolerated the procedure well.  There were no complications. The patient was allowed to wake from anesthesia, extubated and taken to the recovery room in satisfactory condition.  I was present for the entire procedure.

## 2021-11-06 NOTE — Interval H&P Note (Signed)
Patient seen and examined. Risks and benefits discussed. Proceed with surgery.

## 2021-11-06 NOTE — Anesthesia Postprocedure Evaluation (Signed)
Anesthesia Post Note  Patient: Jessica Lang  Procedure(s) Performed: MAMMARY REDUCTION  (BREAST) (Bilateral: Breast)     Patient location during evaluation: PACU Anesthesia Type: General Level of consciousness: awake and alert, oriented and patient cooperative Pain management: pain level controlled Vital Signs Assessment: post-procedure vital signs reviewed and stable Respiratory status: spontaneous breathing, nonlabored ventilation and respiratory function stable Cardiovascular status: blood pressure returned to baseline and stable Postop Assessment: no apparent nausea or vomiting Anesthetic complications: no   No notable events documented.  Last Vitals:  Vitals:   11/06/21 1110 11/06/21 1125  BP: 100/66 93/61  Pulse: 60 64  Resp: 14 12  Temp: 36.4 C   SpO2: 98% 99%    Last Pain:  Vitals:   11/06/21 1125  TempSrc:   PainSc: [place]

## 2021-11-06 NOTE — Discharge Instructions (Addendum)
Activity: Avoid strenuous activity.  No heavy lifting.  Diet: No restrictions.  Try to optimize nutrition with plenty of fruits and vegetables to improve healing.  Wound Care: Leave ACE wrap for 3 days.  You may then remove and shower normally.  After ACE wrap comes off recommend sports bra for gentle compression.  Avoid any bra with under-wire until cleared by Dr. Claudia Desanctis.  Replace gauze and ABD pads as needed.  These can be obtained at a local pharmacy.  Leave the Steri-Strips intact.  Follow-Up: Scheduled for next week.  Things to watch for:  Call the office if you experience fever, chills, persistent nausea, or significant bleeding.  Mild wound drainage is common after breast reduction surgery and should not be cause for alarm.

## 2021-11-07 ENCOUNTER — Encounter (HOSPITAL_COMMUNITY): Payer: Self-pay | Admitting: Plastic Surgery

## 2021-11-09 ENCOUNTER — Telehealth: Payer: Self-pay

## 2021-11-09 ENCOUNTER — Encounter (HOSPITAL_COMMUNITY): Payer: Self-pay | Admitting: Emergency Medicine

## 2021-11-09 ENCOUNTER — Emergency Department (HOSPITAL_COMMUNITY)
Admission: EM | Admit: 2021-11-09 | Discharge: 2021-11-09 | Disposition: A | Discharge status: Home / Self Care | Payer: Medicare Other | Attending: Emergency Medicine | Admitting: Emergency Medicine

## 2021-11-09 ENCOUNTER — Emergency Department (HOSPITAL_BASED_OUTPATIENT_CLINIC_OR_DEPARTMENT_OTHER): Payer: Medicare Other

## 2021-11-09 DIAGNOSIS — J441 Chronic obstructive pulmonary disease with (acute) exacerbation: Secondary | ICD-10-CM | POA: Diagnosis not present

## 2021-11-09 DIAGNOSIS — M7989 Other specified soft tissue disorders: Secondary | ICD-10-CM

## 2021-11-09 DIAGNOSIS — R600 Localized edema: Secondary | ICD-10-CM

## 2021-11-09 DIAGNOSIS — R209 Unspecified disturbances of skin sensation: Secondary | ICD-10-CM | POA: Diagnosis not present

## 2021-11-09 DIAGNOSIS — Z87891 Personal history of nicotine dependence: Secondary | ICD-10-CM | POA: Insufficient documentation

## 2021-11-09 DIAGNOSIS — R2243 Localized swelling, mass and lump, lower limb, bilateral: Secondary | ICD-10-CM | POA: Diagnosis not present

## 2021-11-09 DIAGNOSIS — R609 Edema, unspecified: Secondary | ICD-10-CM

## 2021-11-09 LAB — CBC WITH DIFFERENTIAL/PLATELET
Abs Immature Granulocytes: 0.05 10*3/uL (ref 0.00–0.07)
Basophils Absolute: 0 10*3/uL (ref 0.0–0.1)
Basophils Relative: 1 %
Eosinophils Absolute: 0.6 10*3/uL — ABNORMAL HIGH (ref 0.0–0.5)
Eosinophils Relative: 7 %
HCT: 38.1 % (ref 36.0–46.0)
Hemoglobin: 12.1 g/dL (ref 12.0–15.0)
Immature Granulocytes: 1 %
Lymphocytes Relative: 34 %
Lymphs Abs: 2.5 10*3/uL (ref 0.7–4.0)
MCH: 31.7 pg (ref 26.0–34.0)
MCHC: 31.8 g/dL (ref 30.0–36.0)
MCV: 99.7 fL (ref 80.0–100.0)
Monocytes Absolute: 0.6 10*3/uL (ref 0.1–1.0)
Monocytes Relative: 8 %
Neutro Abs: 3.7 10*3/uL (ref 1.7–7.7)
Neutrophils Relative %: 49 %
Platelets: 167 10*3/uL (ref 150–400)
RBC: 3.82 MIL/uL — ABNORMAL LOW (ref 3.87–5.11)
RDW: 13.4 % (ref 11.5–15.5)
WBC: 7.4 10*3/uL (ref 4.0–10.5)
nRBC: 0 % (ref 0.0–0.2)

## 2021-11-09 LAB — BASIC METABOLIC PANEL
Anion gap: 7 (ref 5–15)
BUN: 23 mg/dL — ABNORMAL HIGH (ref 6–20)
CO2: 31 mmol/L (ref 22–32)
Calcium: 10 mg/dL (ref 8.9–10.3)
Chloride: 102 mmol/L (ref 98–111)
Creatinine, Ser: 0.96 mg/dL (ref 0.44–1.00)
GFR, Estimated: >60 mL/min (ref >60)
Glucose, Bld: 99 mg/dL (ref 70–99)
Potassium: 4.8 mmol/L (ref 3.5–5.1)
Sodium: 140 mmol/L (ref 135–145)

## 2021-11-09 LAB — SURGICAL PATHOLOGY

## 2021-11-09 NOTE — ED Triage Notes (Signed)
Patient sent to ED after calling surgeon about lower extremity edema that started over the weekend after breast reduction on 11/06/2021. Patient denies pain, denies shortness of breath. Patient is alert, oriented, ambulatory, and in no apparent distress at this time.
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
## 2021-11-09 NOTE — ED Provider Triage Note (Signed)
Emergency Medicine Provider Triage Evaluation Note  Jessica Lang , a 61 y.o. female  was evaluated in triage.  Pt complains of bilateral lower leg swelling.  Patient had a pressure reduction last week.  No shortness of breath or chest pain.  Sent here by her doctor to rule out blood clots.  Review of Systems  Positive:  Negative: See above   Physical Exam  BP 114/75 (BP Location: Left Arm)    Pulse 88    Temp 98.4 F (36.9 C) (Oral)    Resp 16    SpO2 90%  Gen:   Awake, no distress   Resp:  Normal effort  MSK:   Moves extremities without difficulty  Other:  2+ pitting edema up to the knees bilaterally.  Medical Decision Making  Medically screening exam initiated at 1:54 PM.  Appropriate orders placed.  Jessica Lang was informed that the remainder of the evaluation will be completed by another provider, this initial triage assessment does not replace that evaluation, and the importance of remaining in the ED until their evaluation is complete.     Jessica Lang [place], [place] 11/09/21 1355

## 2021-11-09 NOTE — Discharge Instructions (Signed)
The ultrasound of your lower extremity did not show a clot or a cyst.  Please follow-up with your primary care doctor in 2 days for post ED follow-up visit.

## 2021-11-09 NOTE — Progress Notes (Signed)
Lower extremity venous has been completed.   Preliminary results in CV Proc.   Jinny Blossom Ilhan Madan 11/09/2021 2:47 PM

## 2021-11-09 NOTE — Telephone Encounter (Signed)
Patient called to report swelling of both legs from the middle of her calves to the middle of her thighs. She noticed it a few hours ago. There is no pain, redness, or tenderness, and she does not have a fever.

## 2021-11-09 NOTE — Telephone Encounter (Signed)
Called and spoke with the patient regarding the message below.  Patient stated that she noticed the swelling this morning, no pain,are SOB. She feels the swelling behind her knees.  Informed the patient that Dr. Claudia Desanctis is not in the office, he's in surgery.   But I will speak with Dr. Erin Hearing regarding her call.   Patient agreed.  Informed the patient that I spoke with Dr. Erin Hearing and he stated that he feels she should go to the ED to be evaluated for the swelling.  To make sure she does not have a blood clot, or fluid over load.  Patient verbalized understanding and agreed to go to the ED.  She stated that she will call to get a ride and then head to [facility] ED.//AB/CMA

## 2021-11-10 LAB — CBG MONITORING, ED: Glucose-Capillary: 105 mg/dL — ABNORMAL HIGH (ref 70–99)

## 2021-11-11 ENCOUNTER — Telehealth: Payer: Self-pay | Admitting: General Practice

## 2021-11-11 NOTE — Telephone Encounter (Signed)
Transition Care Management Follow-up Telephone Call ?Date of discharge and from where: 11/09/21 from [facility] ?How have you been since you were released from the hospital? Doing ok and still a little sore from the surgery she had a few weeks ago. But leg edema is better. ?Any questions or concerns? No ? ?Items Reviewed: ?Did the pt receive and understand the discharge instructions provided? Yes  ?Medications obtained and verified? No  ?Other? No  ?Any new allergies since your discharge? No  ?Dietary orders reviewed? Yes ?Do you have support at home? Yes  ? ?Home Care and Equipment/Supplies: ?Were home health services ordered? no ? ?Functional Questionnaire: (I = Independent and D = Dependent) ?ADLs: I ? ?Bathing/Dressing- I ? ?Meal Prep- I ? ?Eating- I ? ?Maintaining continence- I ? ?Transferring/Ambulation- I ? ?Managing Meds- I ? ?Follow up appointments reviewed: ? ?PCP Hospital f/u appt confirmed? No  Patient will call back to schedule. ?[facility] f/u appt confirmed? No   ?Are transportation arrangements needed? No  ?If their condition worsens, is the pt aware to call PCP or go to the Emergency Dept.? Yes ?Was the patient provided with contact information for the PCP's office or ED? Yes ?Was to pt encouraged to call back with questions or concerns? Yes  ?

## 2021-11-12 ENCOUNTER — Other Ambulatory Visit: Payer: Self-pay

## 2021-11-12 ENCOUNTER — Ambulatory Visit (INDEPENDENT_AMBULATORY_CARE_PROVIDER_SITE_OTHER): Payer: Medicare Other | Admitting: Plastic Surgery

## 2021-11-12 DIAGNOSIS — N62 Hypertrophy of breast: Secondary | ICD-10-CM

## 2021-11-12 NOTE — Progress Notes (Signed)
Patient presents about 1 week out from bilateral breast reduction.  She feels good and is happy.  On exam everything looks to be healing fine with intact incisions and viable nipple areolar complexes.  There is no subcutaneous fluid.  I have asked her to continue compressive garments and avoid strenuous activity.  We will see her again in a few weeks. ? ?

## 2021-11-24 NOTE — Progress Notes (Signed)
Patient is a pleasant 61 year old female with PMH of macromastia s/p bilateral breast reduction performed 11/06/2021 with Dr. Claudia Desanctis who presents to clinic for postoperative follow-up. ? ?Patient was last seen here in clinic on 11/12/2021.  At that time, exam was entirely reassuring and plan was for compressive garments and activity modifications. ? ?Today, patient states that she has been doing well.  She is wearing compressive garments at all times.  No complaints.  Specifically, denies any drainage, wounds, bleeding, redness, or swelling.  ? ?Physical exam is entirely reassuring.  Breasts with excellent shape and symmetry.  No wounds appreciated on exam.  Steri-Strips remain around nipples and on vertical limbs.  Firmly intact, will not remove here today. ? ?Patient to return in 3 weeks for final postoperative exam.  At that time, we will take additional photos as well as discussed scar creams and any ongoing restrictions. ?

## 2021-11-26 ENCOUNTER — Ambulatory Visit (INDEPENDENT_AMBULATORY_CARE_PROVIDER_SITE_OTHER): Payer: Medicare Other | Admitting: Physician Assistant

## 2021-11-26 ENCOUNTER — Encounter: Payer: Self-pay | Admitting: Physician Assistant

## 2021-11-26 ENCOUNTER — Other Ambulatory Visit: Payer: Self-pay

## 2021-11-26 DIAGNOSIS — Z9889 Other specified postprocedural states: Secondary | ICD-10-CM

## 2021-12-15 NOTE — Progress Notes (Signed)
Patient is a pleasant 61 year old female with PMH of macromastia s/p bilateral breast reduction performed 11/06/2021 with Dr. Claudia Desanctis who presents to clinic for postoperative follow-up. ? ?Patient was last seen here in clinic on 11/26/2021.  At that time, exam was entirely reassuring.  Good shape and symmetry on exam.  Plan was for her to return in few weeks for likely final postoperative exam. ? ?Today, patient is doing well.  She tells me that her upper back, neck, and shoulder discomfort has been completely alleviated since her breast reduction surgery.  She is extremely pleased with the cosmetic outcome. ? ?On physical exam, breasts with excellent shape and symmetry.  No wounds.  No areas of erythema or induration.  No subcutaneous fluid collections. ? ?She is not interested in any scar mitigation creams.  She will continue to wear her neoprene compressive bra at night as it is comfortable for her.  Discussed any ongoing restrictions. ? ?No specific follow-up needed, but she can certainly call the clinic should she have any additional questions or concerns.  Picture(s) obtained of the patient and placed in the chart were with the patient's or guardian's permission. ? ?Will e-mail pictures to patients per their request. ?

## 2021-12-17 ENCOUNTER — Ambulatory Visit (INDEPENDENT_AMBULATORY_CARE_PROVIDER_SITE_OTHER): Payer: Medicare Other | Admitting: Physician Assistant

## 2021-12-17 DIAGNOSIS — Z9889 Other specified postprocedural states: Secondary | ICD-10-CM

## 2021-12-23 ENCOUNTER — Ambulatory Visit: Payer: Medicare Other | Admitting: Family Medicine

## 2022-02-23 ENCOUNTER — Other Ambulatory Visit (HOSPITAL_COMMUNITY): Payer: Self-pay

## 2022-02-23 MED ORDER — FLUOXETINE HCL 20 MG PO CAPS: ORAL_CAPSULE | ORAL | 0 refills | Status: DC

## 2022-03-24 ENCOUNTER — Telehealth: Payer: Self-pay | Admitting: *Deleted

## 2022-03-24 NOTE — Telephone Encounter (Signed)
Call placed to pt to make her aware that the medical records she brought in for Dr. Claudia Desanctis are still here. Mailed per pt request to: [name], [place] [phone]

## 2022-03-26 ENCOUNTER — Telehealth (INDEPENDENT_AMBULATORY_CARE_PROVIDER_SITE_OTHER): Payer: Medicare Other | Admitting: Psychiatry

## 2022-03-26 ENCOUNTER — Encounter (HOSPITAL_COMMUNITY): Payer: Self-pay | Admitting: Psychiatry

## 2022-03-26 DIAGNOSIS — F411 Generalized anxiety disorder: Secondary | ICD-10-CM | POA: Diagnosis not present

## 2022-03-26 DIAGNOSIS — F5102 Adjustment insomnia: Secondary | ICD-10-CM | POA: Diagnosis not present

## 2022-03-26 DIAGNOSIS — F332 Major depressive disorder, recurrent severe without psychotic features: Secondary | ICD-10-CM | POA: Diagnosis not present

## 2022-03-26 MED ORDER — OXCARBAZEPINE 150 MG PO TABS: 300.0000 mg | ORAL_TABLET | Freq: Every day | ORAL | 0 refills | Status: DC

## 2022-03-26 MED ORDER — FLUOXETINE HCL 20 MG PO CAPS: ORAL_CAPSULE | ORAL | 0 refills | Status: DC
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
## 2022-04-27 ENCOUNTER — Other Ambulatory Visit: Payer: Self-pay | Admitting: Family Medicine

## 2022-04-27 ENCOUNTER — Ambulatory Visit (INDEPENDENT_AMBULATORY_CARE_PROVIDER_SITE_OTHER): Payer: Medicare Other | Admitting: Family Medicine

## 2022-04-27 ENCOUNTER — Telehealth: Payer: Self-pay | Admitting: Family Medicine

## 2022-04-27 ENCOUNTER — Encounter: Payer: Self-pay | Admitting: Family Medicine

## 2022-04-27 VITALS — BP 128/74 | HR 60 | Temp 96.8°F | Ht 64.0 in | Wt 189.0 lb

## 2022-04-27 DIAGNOSIS — E669 Obesity, unspecified: Secondary | ICD-10-CM | POA: Insufficient documentation

## 2022-04-27 DIAGNOSIS — E559 Vitamin D deficiency, unspecified: Secondary | ICD-10-CM | POA: Diagnosis not present

## 2022-04-27 DIAGNOSIS — F32A Depression, unspecified: Secondary | ICD-10-CM

## 2022-04-27 DIAGNOSIS — J3089 Other allergic rhinitis: Secondary | ICD-10-CM | POA: Diagnosis not present

## 2022-04-27 DIAGNOSIS — I498 Other specified cardiac arrhythmias: Secondary | ICD-10-CM | POA: Diagnosis not present

## 2022-04-27 DIAGNOSIS — F419 Anxiety disorder, unspecified: Secondary | ICD-10-CM

## 2022-04-27 DIAGNOSIS — J431 Panlobular emphysema: Secondary | ICD-10-CM

## 2022-04-27 MED ORDER — STIOLTO RESPIMAT 2.5-2.5 MCG/ACT IN AERS: 2.0000 | INHALATION_SPRAY | Freq: Every day | RESPIRATORY_TRACT | 3 refills | Status: DC

## 2022-04-27 MED ORDER — MOMETASONE FUROATE 50 MCG/ACT NA SUSP: 2.0000 | Freq: Every day | NASAL | 12 refills | Status: DC

## 2022-04-27 MED ORDER — UMECLIDINIUM-VILANTEROL 62.5-25 MCG/ACT IN AEPB: 1.0000 | INHALATION_SPRAY | RESPIRATORY_TRACT | 11 refills | Status: DC

## 2022-04-27 MED ORDER — PHENTERMINE HCL 15 MG PO CAPS: 15.0000 mg | ORAL_CAPSULE | ORAL | 0 refills | Status: DC

## 2022-04-27 MED ORDER — FEXOFENADINE-PSEUDOEPHED ER 180-240 MG PO TB24: 1.0000 | ORAL_TABLET | Freq: Every evening | ORAL | 11 refills | Status: DC

## 2022-04-27 MED ORDER — ALBUTEROL SULFATE HFA 108 (90 BASE) MCG/ACT IN AERS: 2.0000 | INHALATION_SPRAY | Freq: Four times a day (QID) | RESPIRATORY_TRACT | 11 refills | Status: AC | PRN
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
## 2022-04-27 NOTE — Telephone Encounter (Signed)
I sent in Stiolto to replace the Anoro.  Hopefully it will be more affordable.  If for some reason it is not, please let me know.  I sent in the pheniramine as well

## 2022-04-28 ENCOUNTER — Other Ambulatory Visit: Payer: Self-pay | Admitting: Family Medicine

## 2022-04-28 ENCOUNTER — Other Ambulatory Visit: Payer: Medicare Other

## 2022-04-28 DIAGNOSIS — E559 Vitamin D deficiency, unspecified: Secondary | ICD-10-CM

## 2022-04-28 MED ORDER — BEVESPI AEROSPHERE 9-4.8 MCG/ACT IN AERO: 2.0000 | INHALATION_SPRAY | Freq: Two times a day (BID) | RESPIRATORY_TRACT | 11 refills | Status: DC

## 2022-04-28 NOTE — Telephone Encounter (Signed)
ANORO ELLIPTA 62.5-25 MCG/ACT AEPB        Changed from: Tiotropium Bromide-Olodaterol (STIOLTO RESPIMAT) 2.5-2.5 MCG/ACT AERS    Pharmacy comment: Alternative Requested:THE PRESCRIBED MEDICATION IS NOT COVERED BY INSURANCE. PLEASE CONSIDER CHANGING TO ONE OF THE SUGGESTED COVERED ALTERNATIVES.  All Pharmacy Suggested Alternatives:   umeclidinium-vilanterol (ANORO ELLIPTA) 62.5-25 MCG/ACT AEPB Glycopyrrolate-Formoterol (BEVESPI AEROSPHERE) 9-4.8 MCG/ACT AERO

## 2022-04-28 NOTE — Telephone Encounter (Signed)
Called patient, no answer, left message to return call 

## 2022-04-28 NOTE — Telephone Encounter (Signed)
Bevespi sent. Insurance seems to be not covering very well. I've looked into everything available and this is the lowest tier of any I could find that insurance covers.

## 2022-04-29 LAB — VITAMIN D 25 HYDROXY (VIT D DEFICIENCY, FRACTURES): Vit D, 25-Hydroxy: 51 ng/mL (ref 30.0–100.0)

## 2022-04-29 NOTE — Progress Notes (Signed)
Hello Carolyn,  Your lab result is normal and/or stable.Some minor variations that are not significant are commonly marked abnormal, but do not represent any medical problem for you.  Best regards, Claretta Fraise, M.D.

## 2022-04-29 NOTE — Telephone Encounter (Signed)
CALLED PATIENT, NO ANSWER °

## 2022-06-21 ENCOUNTER — Other Ambulatory Visit (HOSPITAL_COMMUNITY): Payer: Self-pay | Admitting: Psychiatry

## 2022-06-22 ENCOUNTER — Telehealth: Payer: Self-pay | Admitting: *Deleted

## 2022-06-22 ENCOUNTER — Ambulatory Visit (INDEPENDENT_AMBULATORY_CARE_PROVIDER_SITE_OTHER): Payer: Medicare Other

## 2022-06-22 ENCOUNTER — Ambulatory Visit (INDEPENDENT_AMBULATORY_CARE_PROVIDER_SITE_OTHER): Payer: Medicare Other | Admitting: Family Medicine

## 2022-06-22 ENCOUNTER — Encounter: Payer: Self-pay | Admitting: Family Medicine

## 2022-06-22 VITALS — BP 115/71 | HR 78 | Temp 98.0°F | Ht 64.0 in | Wt 182.0 lb

## 2022-06-22 DIAGNOSIS — M79672 Pain in left foot: Secondary | ICD-10-CM | POA: Diagnosis not present

## 2022-06-22 DIAGNOSIS — J432 Centrilobular emphysema: Secondary | ICD-10-CM

## 2022-06-22 DIAGNOSIS — Z87891 Personal history of nicotine dependence: Secondary | ICD-10-CM | POA: Insufficient documentation

## 2022-06-22 DIAGNOSIS — M79671 Pain in right foot: Secondary | ICD-10-CM | POA: Diagnosis not present

## 2022-06-22 MED ORDER — PREDNISONE 10 MG PO TABS: ORAL_TABLET | ORAL | 0 refills | Status: DC

## 2022-06-22 MED ORDER — BUDESONIDE-FORMOTEROL FUMARATE 160-4.5 MCG/ACT IN AERO: 2.0000 | INHALATION_SPRAY | Freq: Two times a day (BID) | RESPIRATORY_TRACT | 5 refills | Status: DC

## 2022-06-22 NOTE — Progress Notes (Signed)
Chief Complaint  Patient presents with   bunion left foot    HPI  Patient presents today for pain in both feet for 1-2 weeks. SEVERE on left for 3-4 days. Points to 5th MTP (Not 1st) bilaterally.   PMH: Smoking status noted ROS: Per HPI  Objective: BP 115/71   Pulse 78   Temp 98 F (36.7 C)   Ht '5\' 4"'$  (1.626 m)   Wt 182 lb (82.6 kg)   BMI 31.24 kg/m  Gen: NAD, alert, cooperative with exam HEENT: NCAT, EOMI, PERRL CV: RRR, good S1/S2, no murmur Resp: CTABL, no wheezes, non-labored Abd: SNTND, BS present, no guarding or organomegaly Ext: No edema, warm Neuro: Alert and oriented, No gross deficits  Assessment and plan:  1. Foot pain, bilateral   2. Stopped smoking with greater than 40 pack year history   3. Centrilobular emphysema ([place])     Meds ordered this encounter  Medications   DISCONTD: predniSONE (DELTASONE) 10 MG tablet    Sig: Take 5 daily for 2 days followed by 4,3,2 and 1 for 2 days each.    Dispense:  30 tablet    Refill:  0   DISCONTD: budesonide-formoterol (SYMBICORT) 160-4.5 MCG/ACT inhaler    Sig: Inhale 2 puffs into the lungs 2 (two) times daily.    Dispense:  1 each    Refill:  5   predniSONE (DELTASONE) 10 MG tablet    Sig: Take 5 daily for 2 days followed by 4,3,2 and 1 for 2 days each.    Dispense:  30 tablet    Refill:  0   budesonide-formoterol (SYMBICORT) 160-4.5 MCG/ACT inhaler    Sig: Inhale 2 puffs into the lungs 2 (two) times daily.    Dispense:  1 each    Refill:  5    Orders Placed This Encounter  Procedures   DG Foot Complete Left    Standing Status:   Future    Number of Occurrences:   1    Standing Expiration Date:   07/23/2022    Order Specific Question:   Reason for Exam (SYMPTOM  OR DIAGNOSIS REQUIRED)    Answer:   pain    Order Specific Question:   Is the patient pregnant?    Answer:   No    Order Specific Question:   Preferred imaging location?    Answer:   Internal   DG Foot Complete Right    Standing Status:    Future    Number of Occurrences:   1    Standing Expiration Date:   07/23/2022    Order Specific Question:   Reason for Exam (SYMPTOM  OR DIAGNOSIS REQUIRED)    Answer:   pain    Order Specific Question:   Is the patient pregnant?    Answer:   No    Order Specific Question:   Preferred imaging location?    Answer:   Internal   CT CHEST LUNG CANCER SCREENING LOW DOSE WO CONTRAST    Standing Status:   Future    Standing Expiration Date:   09/22/2022    Order Specific Question:   Preferred Imaging Location?    Answer:   External    Order Specific Question:   Radiology Contrast Protocol - do NOT remove file path    Answer:   [email or web address]    Order Specific Question:   Is patient pregnant?    Answer:   No    Follow up as needed.  Cletus Gash  Livia Snellen, MD

## 2022-06-24 ENCOUNTER — Encounter: Payer: Self-pay | Admitting: Family Medicine

## 2022-06-24 NOTE — Progress Notes (Signed)
Hello Jessica Lang,  Your lab result is normal and/or stable.Some minor variations that are not significant are commonly marked abnormal, but do not represent any medical problem for you.  Best regards, Claretta Fraise, M.D.

## 2022-07-29 ENCOUNTER — Telehealth: Payer: Self-pay | Admitting: Family Medicine

## 2022-07-29 NOTE — Telephone Encounter (Signed)
Left message for patient to call back and schedule Medicare Annual Wellness Visit (AWV) to be completed by video or phone.   Last AWV: 10/01/2019   Please schedule at anytime with [place]     45 minute appointment  Any questions, please contact me at [phone]

## 2022-08-01 ENCOUNTER — Other Ambulatory Visit: Payer: Self-pay | Admitting: Family Medicine

## 2022-08-01 DIAGNOSIS — E669 Obesity, unspecified: Secondary | ICD-10-CM

## 2022-08-03 ENCOUNTER — Other Ambulatory Visit: Payer: Self-pay

## 2022-08-03 DIAGNOSIS — Z122 Encounter for screening for malignant neoplasm of respiratory organs: Secondary | ICD-10-CM

## 2022-08-03 DIAGNOSIS — Z87891 Personal history of nicotine dependence: Secondary | ICD-10-CM

## 2022-09-10 ENCOUNTER — Ambulatory Visit (INDEPENDENT_AMBULATORY_CARE_PROVIDER_SITE_OTHER): Payer: Medicare Other

## 2022-09-10 ENCOUNTER — Encounter: Payer: Self-pay | Admitting: Family Medicine

## 2022-09-10 ENCOUNTER — Ambulatory Visit (INDEPENDENT_AMBULATORY_CARE_PROVIDER_SITE_OTHER): Payer: Medicare Other | Admitting: Family Medicine

## 2022-09-10 VITALS — BP 117/78 | HR 79 | Temp 98.4°F | Ht 64.0 in | Wt 190.0 lb

## 2022-09-10 DIAGNOSIS — R051 Acute cough: Secondary | ICD-10-CM | POA: Diagnosis not present

## 2022-09-10 DIAGNOSIS — R0989 Other specified symptoms and signs involving the circulatory and respiratory systems: Secondary | ICD-10-CM | POA: Diagnosis not present

## 2022-09-10 DIAGNOSIS — J441 Chronic obstructive pulmonary disease with (acute) exacerbation: Secondary | ICD-10-CM | POA: Diagnosis not present

## 2022-09-10 DIAGNOSIS — R0602 Shortness of breath: Secondary | ICD-10-CM

## 2022-09-10 DIAGNOSIS — R059 Cough, unspecified: Secondary | ICD-10-CM | POA: Diagnosis not present

## 2022-09-10 MED ORDER — DOXYCYCLINE HYCLATE 100 MG PO TABS: 100.0000 mg | ORAL_TABLET | Freq: Two times a day (BID) | ORAL | 0 refills | Status: DC

## 2022-09-10 MED ORDER — METHYLPREDNISOLONE ACETATE 80 MG/ML IJ SUSP
80.0000 mg | Freq: Once | INTRAMUSCULAR | Status: AC
  Administered 2022-09-10: 80 mg via INTRAMUSCULAR

## 2022-09-10 MED ORDER — PREDNISONE 20 MG PO TABS: 40.0000 mg | ORAL_TABLET | Freq: Every day | ORAL | 0 refills | Status: AC

## 2022-09-10 NOTE — Patient Instructions (Signed)

## 2022-09-10 NOTE — Progress Notes (Signed)
Acute Office Visit  Subjective:     Patient ID: Jessica Lang, female    DOB: 1960/12/06, 61 y.o.   MRN: [id number]  Chief Complaint  Patient presents with   Cough    Present for at least 2w with no improvement    Cough This is a new problem. Episode onset: 2 days. The problem has been gradually worsening. The cough is Productive of sputum (yellow- gray). Associated symptoms include nasal congestion, shortness of breath (worse than baseline) and wheezing (worse than baseline). Pertinent negatives include no chest pain, chills, ear congestion, ear pain, fever, headaches or sore throat. The symptoms are aggravated by lying down and exercise. She has tried OTC cough suppressant, ipratropium inhaler, a beta-agonist inhaler and steroid inhaler (nasonex) for the symptoms. The treatment provided moderate relief. Her past medical history is significant for bronchitis and COPD. There is no history of asthma, bronchiectasis, emphysema, environmental allergies or pneumonia.     Review of Systems  Constitutional:  Negative for chills and fever.  HENT:  Negative for ear pain and sore throat.   Respiratory:  Positive for cough, shortness of breath (worse than baseline) and wheezing (worse than baseline).   Cardiovascular:  Negative for chest pain.  Neurological:  Negative for headaches.  Endo/Heme/Allergies:  Negative for environmental allergies.        Objective:    BP 117/78   Pulse 79   Temp 98.4 F (36.9 C)   Ht '5\' 4"'$  (1.626 m)   Wt 190 lb (86.2 kg)   SpO2 94%   BMI 32.61 kg/m    Physical Exam Vitals and nursing note reviewed.  Constitutional:      General: She is not in acute distress.    Appearance: She is not toxic-appearing or diaphoretic.  HENT:     Nose: Congestion present.  Cardiovascular:     Rate and Rhythm: Normal rate and regular rhythm.     Heart sounds: Normal heart sounds.  Pulmonary:     Effort: No tachypnea, bradypnea or respiratory distress.     Breath  sounds: Examination of the left-upper field reveals rhonchi. Examination of the right-middle field reveals rhonchi. Examination of the left-middle field reveals rhonchi. Examination of the right-lower field reveals rhonchi. Examination of the left-lower field reveals rhonchi. Rhonchi present.  Musculoskeletal:     Right lower leg: No edema.     Left lower leg: No edema.  Neurological:     General: No focal deficit present.     Mental Status: She is alert and oriented to person, place, and time.  Psychiatric:        Mood and Affect: Mood normal.        Behavior: Behavior normal.        Thought Content: Thought content normal.        Judgment: Judgment normal.     No results found for any visits on 09/10/22.      Assessment & Plan:   Jessica Lang was seen today for cough.  Diagnoses and all orders for this visit:  Acute cough Shortness of breath Xray negative for pneumonia.  -     DG Chest 2 View; Future  COPD with acute exacerbation ([place]) Steroid IM injection today in office. Start prednisone burst tomorrow. Doxycyline as below.  -     doxycycline (VIBRA-TABS) 100 MG tablet; Take 1 tablet (100 mg total) by mouth 2 (two) times daily for 7 days. 1 po bid -     methylPREDNISolone acetate (DEPO-MEDROL)  injection 80 mg -     predniSONE (DELTASONE) 20 MG tablet; Take 2 tablets (40 mg total) by mouth daily with breakfast for 5 days. Start tomorrow   Return if symptoms worsen or fail to improve.  The patient indicates understanding of these issues and agrees with the plan.  Gwenlyn Perking, FNP

## 2022-09-15 ENCOUNTER — Other Ambulatory Visit (HOSPITAL_COMMUNITY): Payer: Self-pay | Admitting: Psychiatry

## 2022-09-16 ENCOUNTER — Encounter: Payer: Self-pay | Admitting: Family Medicine

## 2022-09-16 ENCOUNTER — Telehealth (HOSPITAL_COMMUNITY): Payer: Self-pay | Admitting: *Deleted

## 2022-09-16 MED ORDER — OXCARBAZEPINE 150 MG PO TABS: 300.0000 mg | ORAL_TABLET | Freq: Every day | ORAL | 0 refills | Status: DC

## 2022-09-16 NOTE — Addendum Note (Signed)
Addended by: Merian Capron on: 09/16/2022 11:58 AM   Modules accepted: Orders

## 2022-09-16 NOTE — Telephone Encounter (Signed)
Rx refill request OXcarbazepine (TRILEPTAL) 150 MG tablet   [id number] [place], [place] - [address]  Last Appt 03/26/22 Next Appt 09/23/22

## 2022-09-17 ENCOUNTER — Ambulatory Visit (INDEPENDENT_AMBULATORY_CARE_PROVIDER_SITE_OTHER): Payer: Medicare Other | Admitting: Nurse Practitioner

## 2022-09-17 ENCOUNTER — Encounter: Payer: Self-pay | Admitting: Nurse Practitioner

## 2022-09-17 VITALS — BP 137/89 | HR 72 | Temp 98.2°F | Resp 20 | Ht 64.0 in | Wt 193.0 lb

## 2022-09-17 DIAGNOSIS — J441 Chronic obstructive pulmonary disease with (acute) exacerbation: Secondary | ICD-10-CM

## 2022-09-17 MED ORDER — TRELEGY ELLIPTA 100-62.5-25 MCG/ACT IN AEPB: 1.0000 | INHALATION_SPRAY | Freq: Every day | RESPIRATORY_TRACT | 11 refills | Status: DC

## 2022-09-17 NOTE — Progress Notes (Signed)
   Subjective:    Patient ID: Jessica Lang, female    DOB: 23-Jun-1961, 62 y.o.   MRN: [id number]   Chief Complaint: Cough and Nasal Congestion   Patient was seen last week by T. Lilia Pro and was given doxycycline and steroids. Some better.  Cough This is a new problem. The current episode started 1 to 4 weeks ago. The problem has been waxing and waning. The problem occurs every few minutes. The cough is Productive of sputum. Associated symptoms include a fever and rhinorrhea. Pertinent negatives include no ear congestion or shortness of breath. The symptoms are aggravated by lying down. She has tried OTC cough suppressant for the symptoms. The treatment provided mild relief. Her past medical history is significant for COPD.       Review of Systems  Constitutional:  Positive for fever.  HENT:  Positive for rhinorrhea.   Respiratory:  Positive for cough. Negative for shortness of breath.        Objective:   Physical Exam Vitals reviewed.  Constitutional:      Appearance: Normal appearance.  Cardiovascular:     Rate and Rhythm: Normal rate and regular rhythm.     Heart sounds: Normal heart sounds.  Pulmonary:     Effort: Pulmonary effort is normal.     Breath sounds: Rales (fine in lower bases) present.  Neurological:     General: No focal deficit present.     Mental Status: She is alert and oriented to person, place, and time.  Psychiatric:        Mood and Affect: Mood normal.        Behavior: Behavior normal.    BP 137/89   Pulse 72   Temp 98.2 F (36.8 C) (Temporal)   Resp 20   Ht '5\' 4"'$  (1.626 m)   Wt 193 lb (87.5 kg)   SpO2 96%   BMI 33.13 kg/m         Assessment & Plan:   Jessica Lang in today with chief complaint of Cough and Nasal Congestion   1. COPD with acute exacerbation ([facility]) Continue albuterol as needed 1. Take meds as prescribed 2. Use a cool mist humidifier especially during the winter months and when heat has been humid. 3. Use saline  nose sprays frequently 4. Saline irrigations of the nose can be very helpful if done frequently.  * 4X daily for 1 week*  * Use of a nettie pot can be helpful with this. Follow directions with this* 5. Drink plenty of fluids 6. Keep thermostat turn down low 7.For any cough or congestion- mucinex OTC 8. For fever or aces or pains- take tylenol or ibuprofen appropriate for age and weight.  * for fevers greater than 101 orally you may alternate ibuprofen and tylenol every  3 hours.     - Fluticasone-Umeclidin-Vilant (TRELEGY ELLIPTA) 100-62.5-25 MCG/ACT AEPB; Inhale 1 puff into the lungs daily.  Dispense: 1 each; Refill: 11    The above assessment and management plan was discussed with the patient. The patient verbalized understanding of and has agreed to the management plan. Patient is aware to call the clinic if symptoms persist or worsen. Patient is aware when to return to the clinic for a follow-up visit. Patient educated on when it is appropriate to go to the emergency department.   Mary-Margaret Hassell Done, FNP

## 2022-09-17 NOTE — Patient Instructions (Signed)

## 2022-09-21 ENCOUNTER — Ambulatory Visit (INDEPENDENT_AMBULATORY_CARE_PROVIDER_SITE_OTHER): Payer: Medicare Other | Admitting: Acute Care

## 2022-09-21 ENCOUNTER — Encounter: Payer: Self-pay | Admitting: Acute Care

## 2022-09-21 DIAGNOSIS — Z87891 Personal history of nicotine dependence: Secondary | ICD-10-CM | POA: Diagnosis not present

## 2022-09-21 NOTE — Progress Notes (Signed)
Virtual Visit via Telephone Note  I connected with Jessica Lang on 09/21/22 at  2:00 PM EST by telephone and verified that I am speaking with the correct person using two identifiers.  Location: Patient:  At home Provider:  [place], [place], [place], [address]    I discussed the limitations, risks, security and privacy concerns of performing an evaluation and management service by telephone and the availability of in person appointments. I also discussed with the patient that there may be a patient responsible charge related to this service. The patient expressed understanding and agreed to proceed.   Shared Decision Making Visit Lung Cancer Screening Program [phone])   Eligibility: Age 62 y.o. Pack Years Smoking History Calculation 43 pack year smoking history (# packs/per year x # years smoked) Recent History of coughing up blood  no Unexplained weight loss? no ( >Than 15 pounds within the last 6 months ) Prior History Lung / other cancer no (Diagnosis within the last 5 years already requiring surveillance chest CT Scans). Smoking Status Former Smoker Former Smokers: Years since quit: 2 years  Quit Date: 07/2020  Visit Components: Discussion included one or more decision making aids. yes Discussion included risk/benefits of screening. yes Discussion included potential follow up diagnostic testing for abnormal scans. yes Discussion included meaning and risk of over diagnosis. yes Discussion included meaning and risk of False Positives. yes Discussion included meaning of total radiation exposure. yes  Counseling Included: Importance of adherence to annual lung cancer LDCT screening. yes Impact of comorbidities on ability to participate in the program. yes Ability and willingness to under diagnostic treatment. yes  Smoking Cessation Counseling: Current Smokers:  Discussed importance of smoking cessation. yes Information about tobacco cessation classes and  interventions provided to patient. yes Patient provided with "ticket" for LDCT Scan. yes Symptomatic Patient. no  Counseling NA Diagnosis Code: Tobacco Use Z72.0 Asymptomatic Patient yes  Counseling (Intermediate counseling: > three minutes counseling) P2951 Former Smokers:  Discussed the importance of maintaining cigarette abstinence. yes Diagnosis Code: Personal History of Nicotine Dependence. O84.166 Information about tobacco cessation classes and interventions provided to patient. Yes Patient provided with "ticket" for LDCT Scan. yes Written Order for Lung Cancer Screening with LDCT placed in Epic. Yes (CT Chest Lung Cancer Screening Low Dose W/O CM) [id number] Z12.2-Screening of respiratory organs Z87.891-Personal history of nicotine dependence  I spent 25 minutes of face to face time/virtual visit time  with  Ms.  discussing the risks and benefits of lung cancer screening. We took the time to pause the power point at intervals to allow for questions to be asked and answered to ensure understanding. We discussed that she had taken the single most powerful action possible to decrease her risk of developing lung cancer when she quit smoking. I counseled her to remain smoke free, and to contact me if she ever had the desire to smoke again so that I can provide resources and tools to help support the effort to remain smoke free. We discussed the time and location of the scan, and that either  Doroteo Glassman RN, Joella Prince, RN or I  or I will call / send a letter with the results within  24-72 hours of receiving them. She has the office contact information in the event she needs to speak with me,  she verbalized understanding of all of the above and had no further questions upon leaving the office.     I explained to the patient that there has  been a high incidence of coronary artery disease noted on these exams. I explained that this is a non-gated exam therefore degree or severity cannot be  determined. This patient is not on statin therapy. I have asked the patient to follow-up with their PCP regarding any incidental finding of coronary artery disease and management with diet or medication as they feel is clinically indicated. The patient verbalized understanding of the above and had no further questions.     Magdalen Spatz, NP 09/21/2022

## 2022-09-21 NOTE — Patient Instructions (Signed)
Thank you for participating in the [facility]. It was our pleasure to meet you today. We will call you with the results of your scan within the next few days. Your scan will be assigned a Lung RADS category score by the physicians reading the scans.  This Lung RADS score determines follow up scanning.  See below for description of categories, and follow up screening recommendations. We will be in touch to schedule your follow up screening annually or based on recommendations of our providers. We will fax a copy of your scan results to your Primary Care Physician, or the physician who referred you to the program, to ensure they have the results. Please call the office if you have any questions or concerns regarding your scanning experience or results.  Our office number is [phone]. Please speak with Denise Phelps, RN. , or  Denise Buckner RN, They are  our Lung Cancer Screening RN.'s If They are unavailable when you call, Please leave a message on the voice mail. We will return your call at our earliest convenience.This voice mail is monitored several times a day.  Remember, if your scan is normal, we will scan you annually as long as you continue to meet the criteria for the program. (Age 55-77, Current smoker or smoker who has quit within the last 15 years). If you are a smoker, remember, quitting is the single most powerful action that you can take to decrease your risk of lung cancer and other pulmonary, breathing related problems. We know quitting is hard, and we are here to help.  Please let us know if there is anything we can do to help you meet your goal of quitting. If you are a former smoker, congratulations. We are proud of you! Remain smoke free! Remember you can refer friends or family members through the number above.  We will screen them to make sure they meet criteria for the program. Thank you for helping us take better care of you by  participating in Lung Screening.  You can receive free nicotine replacement therapy ( patches, gum or mints) by calling 1-800-QUIT NOW. Please call so we can get you on the path to becoming  a non-smoker. I know it is hard, but you can do this!  Lung RADS Categories:  Lung RADS 1: no nodules or definitely non-concerning nodules.  Recommendation is for a repeat annual scan in 12 months.  Lung RADS 2:  nodules that are non-concerning in appearance and behavior with a very low likelihood of becoming an active cancer. Recommendation is for a repeat annual scan in 12 months.  Lung RADS 3: nodules that are probably non-concerning , includes nodules with a low likelihood of becoming an active cancer.  Recommendation is for a 6-month repeat screening scan. Often noted after an upper respiratory illness. We will be in touch to make sure you have no questions, and to schedule your 6-month scan.  Lung RADS 4 A: nodules with concerning findings, recommendation is most often for a follow up scan in 3 months or additional testing based on our provider's assessment of the scan. We will be in touch to make sure you have no questions and to schedule the recommended 3 month follow up scan.  Lung RADS 4 B:  indicates findings that are concerning. We will be in touch with you to schedule additional diagnostic testing based on our provider's  assessment of the scan.  Other options for assistance in smoking cessation (   As covered by your insurance benefits)  Hypnosis for smoking cessation  [employer] [phone]  Acupuncture for smoking cessation  [facility] [phone]   

## 2022-09-22 ENCOUNTER — Ambulatory Visit
Admission: RE | Admit: 2022-09-22 | Discharge: 2022-09-22 | Disposition: A | Discharge status: Home / Self Care | Payer: Medicare Other | Source: Ambulatory Visit

## 2022-09-22 DIAGNOSIS — J432 Centrilobular emphysema: Secondary | ICD-10-CM | POA: Diagnosis not present

## 2022-09-22 DIAGNOSIS — Z87891 Personal history of nicotine dependence: Secondary | ICD-10-CM

## 2022-09-22 DIAGNOSIS — K760 Fatty (change of) liver, not elsewhere classified: Secondary | ICD-10-CM | POA: Diagnosis not present

## 2022-09-22 DIAGNOSIS — I700 Atherosclerosis of aorta: Secondary | ICD-10-CM | POA: Diagnosis not present

## 2022-09-22 DIAGNOSIS — Z122 Encounter for screening for malignant neoplasm of respiratory organs: Secondary | ICD-10-CM

## 2022-09-23 ENCOUNTER — Other Ambulatory Visit: Payer: Self-pay

## 2022-09-23 ENCOUNTER — Ambulatory Visit (HOSPITAL_COMMUNITY): Payer: Medicare Other | Admitting: Psychiatry

## 2022-09-23 DIAGNOSIS — Z87891 Personal history of nicotine dependence: Secondary | ICD-10-CM

## 2022-09-23 DIAGNOSIS — Z122 Encounter for screening for malignant neoplasm of respiratory organs: Secondary | ICD-10-CM

## 2022-09-27 ENCOUNTER — Ambulatory Visit (HOSPITAL_COMMUNITY): Payer: Medicare Other | Admitting: Psychiatry

## 2022-10-28 ENCOUNTER — Ambulatory Visit (INDEPENDENT_AMBULATORY_CARE_PROVIDER_SITE_OTHER): Payer: Medicare Other | Admitting: Family Medicine

## 2022-10-28 ENCOUNTER — Encounter: Payer: Self-pay | Admitting: Family Medicine

## 2022-10-28 DIAGNOSIS — J441 Chronic obstructive pulmonary disease with (acute) exacerbation: Secondary | ICD-10-CM

## 2022-10-28 DIAGNOSIS — Z6833 Body mass index (BMI) 33.0-33.9, adult: Secondary | ICD-10-CM

## 2022-10-28 DIAGNOSIS — J3089 Other allergic rhinitis: Secondary | ICD-10-CM | POA: Diagnosis not present

## 2022-10-28 DIAGNOSIS — E669 Obesity, unspecified: Secondary | ICD-10-CM

## 2022-10-28 MED ORDER — TRELEGY ELLIPTA 100-62.5-25 MCG/ACT IN AEPB: 1.0000 | INHALATION_SPRAY | Freq: Every day | RESPIRATORY_TRACT | 11 refills | Status: DC

## 2022-10-28 MED ORDER — MOMETASONE FUROATE 50 MCG/ACT NA SUSP: 2.0000 | Freq: Every day | NASAL | 12 refills | Status: AC

## 2022-10-28 MED ORDER — PHENTERMINE HCL 37.5 MG PO CAPS: 37.5000 mg | ORAL_CAPSULE | ORAL | 1 refills | Status: DC

## 2022-10-28 MED ORDER — FEXOFENADINE-PSEUDOEPHED ER 180-240 MG PO TB24: 1.0000 | ORAL_TABLET | Freq: Every evening | ORAL | 11 refills | Status: DC
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
## 2022-10-31 ENCOUNTER — Encounter: Payer: Self-pay | Admitting: Family Medicine

## 2022-11-03 ENCOUNTER — Telehealth: Payer: Self-pay | Admitting: Family Medicine

## 2022-11-03 NOTE — Telephone Encounter (Signed)
Called patient to schedule Medicare Annual Wellness Visit (AWV). Left message for patient to call back and schedule Medicare Annual Wellness Visit (AWV).  Last date of AWV: : 10/01/2019   Please schedule an appointment at any time with either Mickel Baas or [place], NHA's. .  If any questions, please contact me at [phone].  Thank you,  Colletta Maryland,  [place] Program Direct Dial ??[phone]

## 2022-11-04 NOTE — Telephone Encounter (Signed)
Contacted Jessica Lang to schedule their annual wellness visit. Patient declined to schedule AWV at this time.   Stated she cannot do any type of visit of the phone. Gives her aniexty.  Thank you,  Colletta Maryland,  [place] Program Direct Dial ??[phone]

## 2022-11-08 ENCOUNTER — Telehealth: Payer: Self-pay | Admitting: Family Medicine

## 2022-11-08 NOTE — Telephone Encounter (Signed)
Called patient to schedule Medicare Annual Wellness Visit (AWV). Left message for patient to call back and schedule Medicare Annual Wellness Visit (AWV).  Last date of AWV: 10/01/2019   Please schedule an appointment at any time with either Mickel Baas or [place], NHA's.   If any questions, please contact me at [phone].  .[email or web address]

## 2022-12-13 ENCOUNTER — Telehealth: Payer: Self-pay | Admitting: Family Medicine

## 2022-12-13 NOTE — Telephone Encounter (Signed)
Called patient to schedule Medicare Annual Wellness Visit (AWV). Left message for patient to call back and schedule Medicare Annual Wellness Visit (AWV).  Last date of AWV: 10/01/2019   Please schedule an appointment at any time with either Mickel Baas or [place], NHA's. .  If any questions, please contact me at [phone].  Thank you,  Colletta Maryland,  [place] Program Direct Dial ??[phone]

## 2022-12-18 ENCOUNTER — Other Ambulatory Visit (HOSPITAL_COMMUNITY): Payer: Self-pay | Admitting: Psychiatry

## 2023-01-12 ENCOUNTER — Telehealth: Payer: Self-pay | Admitting: Family Medicine

## 2023-01-12 NOTE — Telephone Encounter (Signed)
Contacted Jessica Lang to schedule their annual wellness visit. Patient declined to schedule AWV at this time.  Per FYI 04.01.24 Patient does not want to do AWV unless it is in the office.   Thank you,  Judeth Cornfield,  [facility] Clinical Support [facility] AWV Program Direct Dial ??[phone]

## 2023-04-28 ENCOUNTER — Encounter: Payer: Self-pay | Admitting: Family Medicine

## 2023-04-28 ENCOUNTER — Ambulatory Visit: Payer: Medicare Other | Admitting: Family Medicine

## 2023-04-28 VITALS — BP 100/60 | HR 69 | Temp 97.5°F | Ht 64.0 in | Wt 169.6 lb

## 2023-04-28 DIAGNOSIS — E669 Obesity, unspecified: Secondary | ICD-10-CM | POA: Diagnosis not present

## 2023-04-28 DIAGNOSIS — J432 Centrilobular emphysema: Secondary | ICD-10-CM

## 2023-04-28 MED ORDER — PHENTERMINE HCL 37.5 MG PO CAPS: 37.5000 mg | ORAL_CAPSULE | ORAL | 1 refills | Status: DC
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
## 2023-04-29 ENCOUNTER — Encounter: Payer: Self-pay | Admitting: Family Medicine

## 2023-06-08 ENCOUNTER — Ambulatory Visit: Payer: Medicare Other

## 2023-06-08 VITALS — BP 120/64 | HR 84 | Temp 98.6°F | Resp 16 | Ht 64.0 in | Wt 165.0 lb

## 2023-06-08 DIAGNOSIS — Z0000 Encounter for general adult medical examination without abnormal findings: Secondary | ICD-10-CM

## 2023-06-08 DIAGNOSIS — Z1231 Encounter for screening mammogram for malignant neoplasm of breast: Secondary | ICD-10-CM
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
## 2023-06-08 NOTE — Addendum Note (Signed)
Addended by: Lorrene Reid on: 06/08/2023 02:01 PM   Modules accepted: Orders, Level of Service

## 2023-06-08 NOTE — Patient Instructions (Signed)
Jessica Lang , Thank you for taking time to come for your Medicare Wellness Visit. I appreciate your ongoing commitment to your health goals. Please review the following plan we discussed and let me know if I can assist you in the future.   Referrals/Orders/Follow-Ups/Clinician Recommendations: Aim for 30 minutes of exercise or brisk walking, 6-8 glasses of water, and 5 servings of fruits and vegetables each day.   This is a list of the screening recommended for you and due dates:  Health Maintenance  Topic Date Due   Mammogram  07/16/2022   Flu Shot  Never done   COVID-19 Vaccine (3 - 2023-24 season) 05/15/2023   Zoster (Shingles) Vaccine (1 of 2) 07/29/2023*   Screening for Lung Cancer  09/23/2023   Cologuard (Stool DNA test)  11/12/2023   Medicare Annual Wellness Visit  06/07/2024   DTaP/Tdap/Td vaccine (2 - Td or Tdap) 09/15/2025   Hepatitis C Screening  Completed   HIV Screening  Completed   HPV Vaccine  Aged Out  *Topic was postponed. The date shown is not the original due date.    Advanced directives: (Copy Requested) Please bring a copy of your health care power of attorney and living will to the office to be added to your chart at your convenience.  Next Medicare Annual Wellness Visit scheduled for next year: Yes insert Preventive Care Attachment Reference

## 2023-08-09 ENCOUNTER — Telehealth: Payer: Self-pay | Admitting: Family Medicine

## 2023-08-09 ENCOUNTER — Encounter: Payer: Self-pay | Admitting: Family Medicine

## 2023-08-09 DIAGNOSIS — E66811 Obesity, class 1: Secondary | ICD-10-CM

## 2023-08-09 NOTE — Telephone Encounter (Unsigned)
Copied from CRM [id number]. Topic: Clinical - Home Health Verbal Orders >> Aug 09, 2023  4:14 PM Almira Coaster wrote: Caller/Agency: [facility] Callback Number: [phone] Service Requested: Clarification on phentermine 37.5 MG capsule Frequency:  Any new concerns about the patient? No

## 2023-08-09 NOTE — Telephone Encounter (Signed)
Pharmacy reports patient has to have BMI of 30 or more for them to fill Phentermine. Patients BMI is 28. Unless they have a comorbidity like DM, HTN, HYPERLIPIDEMIA.

## 2023-08-09 NOTE — Telephone Encounter (Signed)
The pharmacy needs to let her know if they are refusing my order.

## 2023-08-10 MED ORDER — PHENTERMINE HCL 37.5 MG PO CAPS
37.5000 mg | ORAL_CAPSULE | ORAL | 1 refills | Status: DC
Start: 2023-08-10 — End: 2024-02-09

## 2023-08-10 NOTE — Telephone Encounter (Signed)
This is a duplicate message. Patient has been communicating via MyChart.

## 2023-08-24 ENCOUNTER — Other Ambulatory Visit: Payer: Self-pay | Admitting: Acute Care

## 2023-08-24 DIAGNOSIS — Z87891 Personal history of nicotine dependence: Secondary | ICD-10-CM

## 2023-08-24 DIAGNOSIS — Z122 Encounter for screening for malignant neoplasm of respiratory organs: Secondary | ICD-10-CM

## 2023-09-26 ENCOUNTER — Other Ambulatory Visit: Payer: Medicare Other

## 2023-10-31 ENCOUNTER — Ambulatory Visit: Payer: Medicare Other | Admitting: Family Medicine

## 2023-11-28 ENCOUNTER — Ambulatory Visit: Payer: Medicare Other | Admitting: Family Medicine

## 2023-12-12 ENCOUNTER — Ambulatory Visit (INDEPENDENT_AMBULATORY_CARE_PROVIDER_SITE_OTHER): Admitting: Family Medicine

## 2023-12-12 ENCOUNTER — Encounter: Payer: Self-pay | Admitting: Family Medicine

## 2023-12-12 VITALS — BP 109/66 | HR 88 | Temp 97.1°F | Ht 64.0 in | Wt 156.0 lb

## 2023-12-12 DIAGNOSIS — G4726 Circadian rhythm sleep disorder, shift work type: Secondary | ICD-10-CM | POA: Insufficient documentation

## 2023-12-12 DIAGNOSIS — E66811 Obesity, class 1: Secondary | ICD-10-CM | POA: Diagnosis not present

## 2023-12-12 DIAGNOSIS — G2581 Restless legs syndrome: Secondary | ICD-10-CM | POA: Diagnosis not present

## 2023-12-12 MED ORDER — ROPINIROLE HCL 1 MG PO TABS: 1.0000 mg | ORAL_TABLET | Freq: Every day | ORAL | 5 refills | Status: DC
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
## 2024-01-12 ENCOUNTER — Telehealth: Payer: Self-pay

## 2024-01-12 ENCOUNTER — Ambulatory Visit: Payer: Self-pay

## 2024-01-12 ENCOUNTER — Ambulatory Visit (INDEPENDENT_AMBULATORY_CARE_PROVIDER_SITE_OTHER): Admitting: Family Medicine

## 2024-01-12 VITALS — BP 112/81 | HR 87 | Temp 97.4°F | Ht 64.0 in | Wt 151.0 lb

## 2024-01-12 DIAGNOSIS — R109 Unspecified abdominal pain: Secondary | ICD-10-CM

## 2024-01-12 MED ORDER — TRAMADOL HCL 50 MG PO TABS
50.0000 mg | ORAL_TABLET | Freq: Four times a day (QID) | ORAL | 0 refills | Status: AC
Start: 2024-01-12 — End: 2024-01-17

## 2024-01-12 MED ORDER — TIZANIDINE HCL 4 MG PO TABS: 4.0000 mg | ORAL_TABLET | Freq: Four times a day (QID) | ORAL | 1 refills | Status: DC | PRN

## 2024-01-12 MED ORDER — CELECOXIB 200 MG PO CAPS: 200.0000 mg | ORAL_CAPSULE | Freq: Every day | ORAL | 5 refills | Status: DC

## 2024-01-12 MED ORDER — KETOROLAC TROMETHAMINE 60 MG/2ML IM SOLN
60.0000 mg | Freq: Once | INTRAMUSCULAR | Status: AC
Start: 2024-01-12 — End: 2024-01-12
  Administered 2024-01-12: 60 mg via INTRAMUSCULAR

## 2024-01-12 NOTE — Telephone Encounter (Signed)
R10.9

## 2024-01-12 NOTE — Telephone Encounter (Signed)
 Copied from CRM [id number]. Topic: Clinical - Red Word Triage >> Jan 12, 2024 11:05 AM Tiffany H wrote: Red Word that prompted transfer to Nurse Triage: Patient called to advise that she thinks she has an abdominal pain.   Pain level is currently at 12 when she touches the side of her stomach. Feels like a blade stabbing. Pain at idle is just a dull ache.   Chief Complaint: abd pain Symptoms: RLQ abdominal pain Frequency: constant Pertinent Negatives: Patient denies nausea and vomiting Disposition: [] ED /[] Urgent Care (no appt availability in office) / [] Appointment(In office/virtual)/ []  [facility]/ [] Home Care/ [] Refused Recommended Disposition /[] [facility]/ []  Follow-up with PCP Additional Notes: RLQ pain that started yesterday. Pt scheduled for today at 3:20pm   Reason for Disposition  [1] MILD-MODERATE pain AND [2] constant AND [3] present > 2 hours  Answer Assessment - Initial Assessment Questions 1. LOCATION: "Where does it hurt?"      RLQ abd pain  2. RADIATION: "Does the pain shoot anywhere else?" (e.g., chest, back)     Does not radiate  3. ONSET: "When did the pain begin?" (e.g., minutes, hours or days ago)      Yesterday  4. SUDDEN: "Gradual or sudden onset?"     Gradual  5. PATTERN "Does the pain come and go, or is it constant?"    - If it comes and goes: "How long does it last?" "Do you have pain now?"     (Note: Comes and goes means the pain is intermittent. It goes away completely between bouts.)    - If constant: "Is it getting better, staying the same, or getting worse?"      (Note: Constant means the pain never goes away completely; most serious pain is constant and gets worse.)      Constant  6. SEVERITY: "How bad is the pain?"  (e.g., Scale 1-10; mild, moderate, or severe)    - MILD (1-3): Doesn't interfere with normal activities, abdomen soft and not tender to touch.     - MODERATE (4-7): Interferes with normal activities or awakens  from sleep, abdomen tender to touch.     - SEVERE (8-10): Excruciating pain, doubled over, unable to do any normal activities.       Moderate to severe  7. RECURRENT SYMPTOM: "Have you ever had this type of stomach pain before?" If Yes, ask: "When was the last time?" and "What happened that time?"      No  8. CAUSE: "What do you think is causing the stomach pain?"     Digging heavy clay and lifting heavy for the past six days  9. RELIEVING/AGGRAVATING FACTORS: "What makes it better or worse?" (e.g., antacids, bending or twisting motion, bowel movement)     Bending, twist, BM, cough  10. OTHER SYMPTOMS: "Do you have any other symptoms?" (e.g., back pain, diarrhea, fever, urination pain, vomiting)       *No Answer* 11. PREGNANCY: "Is there any chance you are pregnant?" "When was your last menstrual period?"       No  Protocols used: Abdominal Pain - [facility]

## 2024-01-12 NOTE — Telephone Encounter (Signed)
Has apt scheduled

## 2024-01-12 NOTE — Telephone Encounter (Signed)
 Copied from CRM [id number]. Topic: Clinical - Prescription Issue >> Jan 12, 2024  4:43 PM Star East wrote: Reason for CRM: traMADol  (ULTRAM ) 50 MG tablet- [facility] with [facility], pharmacy needs diagnosis code for prescription- Phone: [phone]
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
## 2024-01-13 ENCOUNTER — Other Ambulatory Visit: Payer: Self-pay

## 2024-01-13 ENCOUNTER — Ambulatory Visit (HOSPITAL_COMMUNITY)
Admission: RE | Admit: 2024-01-13 | Discharge: 2024-01-13 | Disposition: A | Discharge status: Home / Self Care | Source: Ambulatory Visit | Attending: Family Medicine | Admitting: Family Medicine

## 2024-01-13 DIAGNOSIS — Z0389 Encounter for observation for other suspected diseases and conditions ruled out: Secondary | ICD-10-CM | POA: Diagnosis not present

## 2024-01-13 DIAGNOSIS — R109 Unspecified abdominal pain: Secondary | ICD-10-CM

## 2024-01-13 NOTE — Telephone Encounter (Signed)
 Dx code provided. LS

## 2024-01-15 ENCOUNTER — Encounter: Payer: Self-pay | Admitting: Family Medicine

## 2024-02-09 ENCOUNTER — Encounter: Payer: Self-pay | Admitting: Family Medicine

## 2024-02-09 ENCOUNTER — Telehealth: Payer: Self-pay

## 2024-02-09 ENCOUNTER — Ambulatory Visit: Admitting: Family Medicine

## 2024-02-09 VITALS — BP 123/72 | HR 69 | Temp 98.4°F | Ht 64.0 in | Wt 152.0 lb

## 2024-02-09 DIAGNOSIS — E782 Mixed hyperlipidemia: Secondary | ICD-10-CM

## 2024-02-09 DIAGNOSIS — G2581 Restless legs syndrome: Secondary | ICD-10-CM | POA: Diagnosis not present

## 2024-02-09 DIAGNOSIS — Z1211 Encounter for screening for malignant neoplasm of colon: Secondary | ICD-10-CM | POA: Diagnosis not present

## 2024-02-09 DIAGNOSIS — J432 Centrilobular emphysema: Secondary | ICD-10-CM

## 2024-02-09 DIAGNOSIS — Z13220 Encounter for screening for lipoid disorders: Secondary | ICD-10-CM | POA: Diagnosis not present

## 2024-02-09 DIAGNOSIS — E66811 Obesity, class 1: Secondary | ICD-10-CM

## 2024-02-09 DIAGNOSIS — E559 Vitamin D deficiency, unspecified: Secondary | ICD-10-CM | POA: Diagnosis not present

## 2024-02-09 DIAGNOSIS — I498 Other specified cardiac arrhythmias: Secondary | ICD-10-CM | POA: Diagnosis not present

## 2024-02-09 DIAGNOSIS — F17210 Nicotine dependence, cigarettes, uncomplicated: Secondary | ICD-10-CM | POA: Diagnosis not present

## 2024-02-09 MED ORDER — TIZANIDINE HCL 4 MG PO TABS: 4.0000 mg | ORAL_TABLET | Freq: Every day | ORAL | 1 refills | Status: DC

## 2024-02-09 MED ORDER — PHENTERMINE HCL 37.5 MG PO CAPS: 37.5000 mg | ORAL_CAPSULE | ORAL | 1 refills | Status: DC
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
## 2024-02-09 NOTE — Telephone Encounter (Signed)
 LVM to call and schedule annual CT. 09/2023 appt cancelled and not rescheduled. New referral received.

## 2024-02-10 ENCOUNTER — Encounter: Payer: Self-pay | Admitting: Family Medicine

## 2024-02-10 ENCOUNTER — Other Ambulatory Visit: Payer: Self-pay | Admitting: Family Medicine

## 2024-02-10 ENCOUNTER — Ambulatory Visit: Payer: Self-pay | Admitting: Family Medicine

## 2024-02-10 DIAGNOSIS — E782 Mixed hyperlipidemia: Secondary | ICD-10-CM

## 2024-02-10 LAB — CMP14+EGFR
ALT: 21 IU/L (ref 0–32)
AST: 24 IU/L (ref 0–40)
Albumin: 4.8 g/dL (ref 3.9–4.9)
Alkaline Phosphatase: 88 IU/L (ref 44–121)
BUN/Creatinine Ratio: 21 (ref 12–28)
BUN: 15 mg/dL (ref 8–27)
Bilirubin Total: 0.3 mg/dL (ref 0.0–1.2)
CO2: 23 mmol/L (ref 20–29)
Calcium: 10.3 mg/dL (ref 8.7–10.3)
Chloride: 101 mmol/L (ref 96–106)
Creatinine, Ser: 0.72 mg/dL (ref 0.57–1.00)
Globulin, Total: 2.4 g/dL (ref 1.5–4.5)
Glucose: 113 mg/dL — ABNORMAL HIGH (ref 70–99)
Potassium: 4.7 mmol/L (ref 3.5–5.2)
Sodium: 140 mmol/L (ref 134–144)
Total Protein: 7.2 g/dL (ref 6.0–8.5)
eGFR: 95 mL/min/{1.73_m2} (ref >59)

## 2024-02-10 LAB — CBC WITH DIFFERENTIAL/PLATELET
Basophils Absolute: 0.1 10*3/uL (ref 0.0–0.2)
Basos: 1 %
EOS (ABSOLUTE): 0.3 10*3/uL (ref 0.0–0.4)
Eos: 4 %
Hematocrit: 44.8 % (ref 34.0–46.6)
Hemoglobin: 14.8 g/dL (ref 11.1–15.9)
Immature Grans (Abs): 0 10*3/uL (ref 0.0–0.1)
Immature Granulocytes: 0 %
Lymphocytes Absolute: 1.7 10*3/uL (ref 0.7–3.1)
Lymphs: 23 %
MCH: 31.4 pg (ref 26.6–33.0)
MCHC: 33 g/dL (ref 31.5–35.7)
MCV: 95 fL (ref 79–97)
Monocytes Absolute: 0.4 10*3/uL (ref 0.1–0.9)
Monocytes: 6 %
Neutrophils Absolute: 4.7 10*3/uL (ref 1.4–7.0)
Neutrophils: 66 %
Platelets: 197 10*3/uL (ref 150–450)
RBC: 4.72 x10E6/uL (ref 3.77–5.28)
RDW: 12.9 % (ref 11.7–15.4)
WBC: 7.2 10*3/uL (ref 3.4–10.8)

## 2024-02-10 LAB — LIPID PANEL
Chol/HDL Ratio: 5.1 ratio — ABNORMAL HIGH (ref 0.0–4.4)
Cholesterol, Total: 320 mg/dL — ABNORMAL HIGH (ref 100–199)
HDL: 63 mg/dL (ref >39)
LDL Chol Calc (NIH): 215 mg/dL — ABNORMAL HIGH (ref 0–99)
Triglycerides: 214 mg/dL — ABNORMAL HIGH (ref 0–149)
VLDL Cholesterol Cal: 42 mg/dL — ABNORMAL HIGH (ref 5–40)

## 2024-02-10 LAB — MAGNESIUM: Magnesium: 2.2 mg/dL (ref 1.6–2.3)

## 2024-02-10 LAB — VITAMIN D 25 HYDROXY (VIT D DEFICIENCY, FRACTURES): Vit D, 25-Hydroxy: 84.9 ng/mL (ref 30.0–100.0)

## 2024-02-11 ENCOUNTER — Encounter: Payer: Self-pay | Admitting: Family Medicine

## 2024-02-14 ENCOUNTER — Telehealth: Payer: Self-pay

## 2024-02-14 NOTE — Progress Notes (Signed)
 Care Guide Pharmacy Note  02/14/2024 Name: Jessica Lang MRN: [id number] DOB: 02/28/1961  Referred By: Roise Cleaver, MD Reason for referral: Complex Care Management (Outreach to schedule with Pharm d )   Jessica Lang is a 63 y.o. year old female who is a primary care patient of Stacks, Vallorie Gayer, MD.  Jessica Lang was referred to the pharmacist for assistance related to: HLD  Successful contact was made with the patient to discuss pharmacy services including being ready for the pharmacist to call at least 5 minutes before the scheduled appointment time and to have medication bottles and any blood pressure readings ready for review. The patient agreed to meet with the pharmacist via telephone visit on (date/time).02/22/2024  Lenton Rail , RMA     [place]  [facility] Guide  Direct Dial: [phone]  Website: [email or web address]

## 2024-02-16 ENCOUNTER — Institutional Professional Consult (permissible substitution): Admitting: Plastic Surgery

## 2024-02-16 ENCOUNTER — Ambulatory Visit (INDEPENDENT_AMBULATORY_CARE_PROVIDER_SITE_OTHER): Admitting: Plastic Surgery

## 2024-02-16 ENCOUNTER — Encounter: Payer: Self-pay | Admitting: Plastic Surgery

## 2024-02-16 VITALS — BP 144/78 | HR 77 | Ht 64.0 in | Wt 155.0 lb

## 2024-02-16 DIAGNOSIS — Z1211 Encounter for screening for malignant neoplasm of colon: Secondary | ICD-10-CM | POA: Diagnosis not present

## 2024-02-16 DIAGNOSIS — D489 Neoplasm of uncertain behavior, unspecified: Secondary | ICD-10-CM

## 2024-02-16 DIAGNOSIS — R2231 Localized swelling, mass and lump, right upper limb: Secondary | ICD-10-CM
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
## 2024-02-22 ENCOUNTER — Other Ambulatory Visit (INDEPENDENT_AMBULATORY_CARE_PROVIDER_SITE_OTHER): Admitting: Pharmacist

## 2024-02-22 DIAGNOSIS — E782 Mixed hyperlipidemia: Secondary | ICD-10-CM

## 2024-02-22 NOTE — Progress Notes (Signed)
 02/22/2024 Name: Jessica Lang MRN: [id number] DOB: Oct 28, 1960  Chief Complaint  Patient presents with   Hyperlipidemia    Jessica Lang is a 63 y.o. year old female who presented for a telephone visit. I connected with  Abbygale Lapid on 02/22/24 by telephone and verified that I am speaking with the correct person using two identifiers. I discussed the limitations of evaluation and management by telemedicine. The patient expressed understanding and agreed to proceed.  Patient was located in her home and PharmD in office during this visit.  They were referred to the pharmacist by their PCP for assistance in managing hyperlipidemia.   Care Team: Primary Care Provider: Roise Cleaver, MD ; Next Scheduled Visit: 08/20/24  Medication Access/Adherence  Current Pharmacy:  [facility] [phone] [place] - [address] [place] [address] Phone: [phone] Fax: [phone]  [facility] [address] [place] - [address] [place] [address] Phone: [phone] Fax: [phone]   Patient reports affordability concerns with their medications: No  Patient reports access/transportation concerns to their pharmacy: No  Patient reports adherence concerns with their medications:  No    Subjective: Patient reports she has had a very bad year and lost several close friends. Due to this she has been emotionally eating and eating things she normally does not have. Reports eating 6 lbs of skittles and m&ms in the past couple months and lots of cookies and crackers. She also was drinking 24 oz of [place] press coffee with cream and sugar daily. Since most recent lipid panel she read this can raise cholesterol and switched back to filtered drip coffee. Also has cut out candy and refined carbohydrates and trying to get back to healthy eating.  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, very physically active  The 10-year ASCVD risk score (Arnett DK, et al., 2019) is: 5.9%   Values used to calculate the score:     Age: 56 years     Sex: Female     Is Non-Hispanic African American: No     Diabetic: No     Tobacco smoker: No     Systolic Blood Pressure: 144 mmHg     Is BP treated: No     HDL Cholesterol: 63 mg/dL     Total Cholesterol: 320 mg/dL  Lipid Panels: 7/42/5956: TC 320, TG 214, LDL 215, HDL 63 02/20/2018: TC 194, TG 146, LDL 109, HDL 59  Objective:  Lab Results  Component Value Date   HGBA1C 5.4 02/20/2018    Lab Results  Component Value Date   CREATININE 0.72 02/09/2024   BUN 15 02/09/2024   NA 140 02/09/2024   K 4.7 02/09/2024   CL 101 02/09/2024   CO2 23 02/09/2024    Lab Results  Component Value Date   CHOL 320 (H) 02/09/2024   HDL 63 02/09/2024   LDLCALC 215 (H) 02/09/2024   TRIG 214 (H) 02/09/2024   CHOLHDL 5.1 (H) 02/09/2024    Medications Reviewed Today     Reviewed by Philmore Bream, RPH (Pharmacist) on 02/22/24 at 1446  Med List Status: <None>   Medication Order Taking? Sig Documenting Provider Last Dose Status Informant  albuterol  (VENTOLIN  HFA) 108 (90 Base) MCG/ACT inhaler [id number] No Inhale 2 puffs into the lungs every 6 (six) hours as needed for wheezing or shortness of breath. Roise Cleaver, MD Taking Active   Ascorbic Acid (VITAMIN C) 1000 MG tablet [id number] No Take 1,000 mg by mouth daily. [provider] Taking Active   cetirizine (ZYRTEC) 10 MG tablet [id number] No Take 10 mg by mouth 2 (two) times daily. [provider] Taking Active   Cholecalciferol  (VITAMIN D3) 125 MCG (5000 UT) CAPS [id number] No Take 5,000 Units by mouth daily. [provider]  Taking Active Self  Magnesium  500 MG TABS [id number] No Take 1,000 mg by mouth daily.  [provider] Taking Active Self  melatonin 5 MG TABS [id number] No Take 5 mg by mouth. [provider] Taking Active   Menthol-Camphor (ICY HOT ADVANCED PAIN RELIEF) 16-11 % CREA [id number] No Apply 1 application topically daily as needed (leg cramps). [provider] Taking Active Self  mometasone  (NASONEX ) 50 MCG/ACT nasal spray [id number] No Place 2 sprays into the nose daily. Roise Cleaver, MD Taking Active   ondansetron  (ZOFRAN ) 4 MG tablet [id number] No Take 1 tablet (4 mg total) by mouth every 8 (eight) hours as needed for nausea or vomiting. Scheeler, Janalyn Me, PA-C Taking Active Self  phentermine  37.5 MG capsule [id number] No Take 1 capsule (37.5 mg total) by mouth every morning. Roise Cleaver, MD Taking Active   Potassium 99 MG TABS [id number] No Take 99 mg by mouth daily as needed (leg cramps). [provider] Taking Active Self  PSYLLIUM HUSK PO [id number] No Take 2 capsules by mouth in the morning and at bedtime. [provider] Taking Active Self  tiZANidine  (ZANAFLEX ) 4 MG tablet [id number] No Take 1-1.5 tablets (4-6 mg total) by mouth at bedtime. To prevent leg cramps at night Roise Cleaver, MD Taking Active   Zinc 50 MG CAPS [id number] No Take 50 mg by mouth daily as needed (cold symptoms). [provider] Taking Active Self              Assessment/Plan:   Hyperlipidemia/ASCVD Risk Reduction: - Currently uncontrolled with last LDL 215 mg/dL and TG 616 mg/dL on 0/73/71. Favor LDL goal <100 mg/dL. Cholesterol is significantly elevated as compared to prior lipid panel in 2019. Suspect that recent dietary indiscretion has contributed to elevations. She also was not fasting at time of labs. While not well studied, appears [place] press coffee has been associated with cholesterol elevations as it is not filtered and she has been drinking a  significant amount of this every day for the past year. Recommended reducing intake of [place] press coffee and she has already completely switched back to filtered drip coffee. Reviewed focus on heart healthy diet. Discussed limiting intake of processed food/snacks, candy, sweets, high fat dairy products. Recommended increasing intake of fruits, vegetables, whole grains, lean protein, and healthy fats like nuts and fish. She is highly motivated to improve lifestyle factors to avoid needing cholesterol medications. Not interested in cholesterol lowering medications at this time. Discussed that if cholesterol remains elevated after implementing lifestyle changes, will need to consider initiation of a statin and she was in agreement. - Reviewed long term complications of uncontrolled cholesterol - Recommend to repeat lipid panel in 3 months. Scheduled lab visit to align with AWV in September in the morning. Counseled to make sure she is fasting for labs.   Follow Up Plan: PharmD on 06/14/24  Georga Killings, PharmD PGY-1 Pharmacy Resident

## 2024-03-20 ENCOUNTER — Ambulatory Visit
Admission: RE | Admit: 2024-03-20 | Discharge: 2024-03-20 | Disposition: A | Discharge status: Home / Self Care | Source: Ambulatory Visit | Attending: Family Medicine | Admitting: Family Medicine

## 2024-03-20 DIAGNOSIS — Z87891 Personal history of nicotine dependence: Secondary | ICD-10-CM

## 2024-03-20 DIAGNOSIS — Z122 Encounter for screening for malignant neoplasm of respiratory organs: Secondary | ICD-10-CM | POA: Diagnosis not present

## 2024-03-26 ENCOUNTER — Ambulatory Visit
Admission: RE | Admit: 2024-03-26 | Discharge: 2024-03-26 | Disposition: A | Discharge status: Home / Self Care | Source: Ambulatory Visit | Attending: Family Medicine | Admitting: Family Medicine

## 2024-03-26 ENCOUNTER — Telehealth: Payer: Self-pay | Admitting: Plastic Surgery

## 2024-03-26 ENCOUNTER — Other Ambulatory Visit: Payer: Self-pay | Admitting: Student

## 2024-03-26 ENCOUNTER — Other Ambulatory Visit: Payer: Self-pay | Admitting: Family Medicine

## 2024-03-26 DIAGNOSIS — Z1231 Encounter for screening mammogram for malignant neoplasm of breast: Secondary | ICD-10-CM

## 2024-03-26 NOTE — Telephone Encounter (Signed)
 Jessica Lang from [facility] call about pt. Pt is sch at [facility] tomorrow at 1pm for MRI. She has questions concerning the Order and DX. She requested someone call before the pts apt tomorrow. Jackson will also be in her office by 630am tomorrow as well.  Dr. Waddell out of office this week

## 2024-03-27 ENCOUNTER — Other Ambulatory Visit: Payer: Self-pay

## 2024-03-27 ENCOUNTER — Ambulatory Visit (HOSPITAL_COMMUNITY)
Admission: RE | Admit: 2024-03-27 | Discharge: 2024-03-27 | Disposition: A | Discharge status: Home / Self Care | Source: Ambulatory Visit | Attending: Plastic Surgery | Admitting: Plastic Surgery

## 2024-03-27 DIAGNOSIS — D489 Neoplasm of uncertain behavior, unspecified: Secondary | ICD-10-CM | POA: Insufficient documentation

## 2024-03-27 DIAGNOSIS — M19012 Primary osteoarthritis, left shoulder: Secondary | ICD-10-CM | POA: Diagnosis not present

## 2024-03-27 MED ORDER — GADOBUTROL 1 MMOL/ML IV SOLN
7.0000 mL | Freq: Once | INTRAVENOUS | Status: AC | PRN
  Administered 2024-03-27: 7 mL via INTRAVENOUS

## 2024-03-27 NOTE — Telephone Encounter (Signed)
 OK, I can speak with her today. Do you have a callback number?

## 2024-04-04 ENCOUNTER — Telehealth: Payer: Self-pay | Admitting: Acute Care

## 2024-04-04 NOTE — Telephone Encounter (Signed)
 Ladies, will you call Elsie Der office and see if he is willing to evaluate her thyroid ? Thanks so much

## 2024-04-04 NOTE — Telephone Encounter (Signed)
 Lung-RADS 2S, benign appearance or behavior. Continue annual screening with low-dose chest CT without contrast in 12 months. 2. The S modifier above refers to potentially clinically significant non lung cancer related findings. Specifically, there is a 1.6 cm right thyroid  nodule. Recommend thyroid  US  (ref: J Am Coll Radiol. 2015 Feb;12(2): 143-50). 3. Aortic Atherosclerosis (ICD10-I70.0). Coronary artery calcifications. Assessment for potential risk factor modification, dietary therapy or pharmacologic therapy may be warranted, if clinically indicated.   Please call patient and let her know her scan was read as a LR 2 12 month follow up due 03/2025.  She needs a thyroid  US  to further evaluate the thyroid  nodule. Dr. Zollie will you order and manage follow up?  There is notation of CAD. She saw Dr. Pietro for bradycardia in 2022. She may need to see him again for CAD. Ask her to call to be seen if she is interested in evaluating the CAD.  Thanks so much everyone.

## 2024-04-05 NOTE — Telephone Encounter (Signed)
 LVM on home and cell to review recent CT results. Once results are reviewed with the patient we will outreach Dr. Zollie office per provider request.

## 2024-04-05 NOTE — Telephone Encounter (Signed)
 Yes! I have LVM to review results with patient. Once she calls back and results are reviewed I will call Dr. Zollie office.

## 2024-04-10 ENCOUNTER — Other Ambulatory Visit: Payer: Self-pay

## 2024-04-10 DIAGNOSIS — F17210 Nicotine dependence, cigarettes, uncomplicated: Secondary | ICD-10-CM

## 2024-04-10 DIAGNOSIS — Z87891 Personal history of nicotine dependence: Secondary | ICD-10-CM

## 2024-04-10 DIAGNOSIS — Z122 Encounter for screening for malignant neoplasm of respiratory organs: Secondary | ICD-10-CM

## 2024-04-10 NOTE — Telephone Encounter (Signed)
 Spoke with patient. Reviewed recent CT results. She will complete annual Lung CT again next year. Annual CT order placed. Pt states she sees Dr. Zollie in a few months for a f/u appt on her cholesterol levels and other recent abnormal blood work. Appt for 09/11/2024 noted. Pt advises she will follow up with Dr. Zollie and complete an US  to evaluate the thyroid  nodule. No additional concerns.   Dr. Zollie, would your office be able to follow up with this patient and order a thyroid  ultrasound for her?   Isaiah, RN

## 2024-04-16 ENCOUNTER — Other Ambulatory Visit: Payer: Self-pay | Admitting: Family Medicine

## 2024-04-16 DIAGNOSIS — E041 Nontoxic single thyroid nodule: Secondary | ICD-10-CM

## 2024-04-16 NOTE — Telephone Encounter (Signed)
 I ordered a thyroid  US . She should hear from scheduling in the next few days

## 2024-04-17 NOTE — Telephone Encounter (Signed)
 Patient aware and verbalized understanding.

## 2024-04-19 ENCOUNTER — Encounter: Payer: Self-pay | Admitting: Family Medicine

## 2024-05-01 ENCOUNTER — Ambulatory Visit (HOSPITAL_COMMUNITY)

## 2024-05-01 ENCOUNTER — Ambulatory Visit (HOSPITAL_COMMUNITY)
Admission: RE | Admit: 2024-05-01 | Discharge: 2024-05-01 | Disposition: A | Discharge status: Home / Self Care | Source: Ambulatory Visit | Attending: Family Medicine | Admitting: Family Medicine

## 2024-05-01 DIAGNOSIS — E041 Nontoxic single thyroid nodule: Secondary | ICD-10-CM | POA: Diagnosis not present

## 2024-05-01 DIAGNOSIS — E042 Nontoxic multinodular goiter: Secondary | ICD-10-CM | POA: Diagnosis not present

## 2024-05-07 ENCOUNTER — Ambulatory Visit: Payer: Self-pay | Admitting: Family Medicine

## 2024-05-15 ENCOUNTER — Encounter: Payer: Self-pay | Admitting: Sports Medicine

## 2024-06-12 ENCOUNTER — Other Ambulatory Visit

## 2024-06-12 DIAGNOSIS — E782 Mixed hyperlipidemia: Secondary | ICD-10-CM | POA: Diagnosis not present

## 2024-06-13 LAB — LIPID PANEL
Chol/HDL Ratio: 2.8 ratio (ref 0.0–4.4)
Cholesterol, Total: 155 mg/dL (ref 100–199)
HDL: 56 mg/dL (ref >39)
LDL Chol Calc (NIH): 82 mg/dL (ref 0–99)
Triglycerides: 93 mg/dL (ref 0–149)
VLDL Cholesterol Cal: 17 mg/dL (ref 5–40)

## 2024-06-14 ENCOUNTER — Other Ambulatory Visit (INDEPENDENT_AMBULATORY_CARE_PROVIDER_SITE_OTHER)

## 2024-06-14 VITALS — BP 100/60 | HR 60 | Wt 135.0 lb

## 2024-06-14 DIAGNOSIS — E785 Hyperlipidemia, unspecified: Secondary | ICD-10-CM

## 2024-06-14 NOTE — Progress Notes (Signed)
 06/14/2024 Name: Jessica Lang MRN: [id number] DOB: 1960/12/10  Chief Complaint  Patient presents with   Hyperlipidemia    Jessica Lang is a 63 y.o. year old female who presented for a telephone visit. I connected with  Jessica Lang on 06/14/24 by telephone and verified that I am speaking with the correct person using two identifiers. I discussed the limitations of evaluation and management by telemedicine. The patient expressed understanding and agreed to proceed.  Patient was located in her home and PharmD in office during this visit.  They were referred to the pharmacist by their PCP for assistance in managing hyperlipidemia.   Care Team: Primary Care Provider: Zollie Lowers, MD ; Next Scheduled Visit: 08/20/24  Medication Access/Adherence  [facility] Pharmacy:  [phone] [place] - [address] [place] [address] Phone: [phone] Fax: [phone]  [facility] [address] [place] - [address] [place] [address] Phone: [phone] Fax: [phone]   Patient reports affordability concerns with their medications: No  Patient reports access/transportation concerns to their pharmacy: No  Patient reports adherence concerns with their medications:  No    Subjective: Patient reports she has worked very hard on diet and lifestyle.  She has lost 60lbs and continues safely on phentermine . She reports this year has been challenging emotionally, but she has overcome this and her labs most certainly prove it.  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, very physically active  The 10-year ASCVD risk score (Arnett  DK, et al., 2019) is: 1.9%   Values used to calculate the score:     Age: 54 years     Clincally relevant sex: Female     Is Non-Hispanic African American: No     Diabetic: No     Tobacco smoker: No     Systolic Blood Pressure: 100 mmHg     Is BP treated: No     HDL Cholesterol: 56 mg/dL     Total Cholesterol: 155 mg/dL  Lipid Panels: Most recent below 06/12/24 02/09/2024: TC 320, TG 214, LDL 215, HDL 63 02/20/2018: TC 194, TG 146, LDL 109, HDL 59  Objective:  Lab Results  Component Value Date   HGBA1C 5.4 02/20/2018    Lab Results  Component Value Date   CREATININE 0.72 02/09/2024   BUN 15 02/09/2024   NA 140 02/09/2024   K 4.7 02/09/2024   CL 101 02/09/2024   CO2 23 02/09/2024    Lab Results  Component Value Date   CHOL 155 06/12/2024   HDL 56 06/12/2024   LDLCALC 82 06/12/2024   TRIG 93 06/12/2024   CHOLHDL 2.8 06/12/2024    Medications Reviewed Today     Reviewed by Billee Mliss BIRCH, [facility] (Pharmacist) on 06/14/24 at 1118  Med List Status: <None>   Medication Order Taking? Sig Documenting Provider Last Dose Status Informant  albuterol  (VENTOLIN  HFA) 108 (90 Base) MCG/ACT inhaler [id number] No Inhale 2 puffs into the lungs every 6 (six) hours as needed for wheezing or shortness of breath. Zollie Lowers, MD Taking Active   Ascorbic Acid (VITAMIN C) 1000 MG tablet [id number] No Take 1,000 mg by mouth daily. [provider] Taking Active  cetirizine (ZYRTEC) 10 MG tablet [id number] No Take 10 mg by mouth 2 (two) times daily. [provider] Taking Active   Cholecalciferol  (VITAMIN D3) 125 MCG (5000 UT) CAPS [id number] No Take 5,000 Units by mouth daily. [provider] Taking Active Self  Magnesium  500 MG TABS [id number] No Take 1,000 mg by mouth daily.  [provider] Taking Active Self  melatonin 5 MG TABS [id number] No Take 5 mg by mouth. [provider] Taking Active   Menthol-Camphor (ICY HOT ADVANCED PAIN RELIEF) 16-11 %  CREA [id number] No Apply 1 application topically daily as needed (leg cramps). [provider] Taking Active Self  mometasone  (NASONEX ) 50 MCG/ACT nasal spray [id number] No Place 2 sprays into the nose daily. Zollie Lowers, MD Taking Active   ondansetron  (ZOFRAN ) 4 MG tablet [id number] No Take 1 tablet (4 mg total) by mouth every 8 (eight) hours as needed for nausea or vomiting. Scheeler, Donnice PARAS, PA-C Taking Active Self  phentermine  37.5 MG capsule [id number] No Take 1 capsule (37.5 mg total) by mouth every morning. Zollie Lowers, MD Taking Active   Potassium 99 MG TABS [id number] No Take 99 mg by mouth daily as needed (leg cramps). [provider] Taking Active Self  PSYLLIUM HUSK PO [id number] No Take 2 capsules by mouth in the morning and at bedtime. [provider] Taking Active Self  tiZANidine  (ZANAFLEX ) 4 MG tablet [id number] No Take 1-1.5 tablets (4-6 mg total) by mouth at bedtime. To prevent leg cramps at night Zollie Lowers, MD Taking Active   Zinc 50 MG CAPS [id number] No Take 50 mg by mouth daily as needed (cold symptoms). [provider] Taking Active Self             Assessment/Plan:   Hyperlipidemia/ASCVD Risk Reduction: - Currently controlled with last LDL 215 mg/dL-->82 and TG 785 fh/iO-->06. Favor LDL goal <100 mg/dL.  She is highly motivated to improve lifestyle factors to avoid needing cholesterol medications. Not interested in cholesterol lowering medications at this time. Discussed that if cholesterol remains elevated after implementing lifestyle changes, will need to consider initiation of a statin and she was in agreement. - Continue diet and lifestyle; continue excellent control   Follow Up Plan: PCP 08/2024  Mliss Tarry Griffin, PharmD, BCACP, CPP Clinical Pharmacist, [facility]

## 2024-06-17 ENCOUNTER — Ambulatory Visit: Payer: Self-pay | Admitting: Family Medicine

## 2024-06-17 NOTE — Progress Notes (Signed)
Hello Catheleen,  Your lab result is normal and/or stable.Some minor variations that are not significant are commonly marked abnormal, but do not represent any medical problem for you.  Best regards, Micheline Markes, M.D.

## 2024-08-05 ENCOUNTER — Other Ambulatory Visit: Payer: Self-pay | Admitting: Family Medicine

## 2024-08-05 DIAGNOSIS — G2581 Restless legs syndrome: Secondary | ICD-10-CM

## 2024-08-17 ENCOUNTER — Ambulatory Visit: Payer: Self-pay

## 2024-08-20 ENCOUNTER — Encounter: Payer: Self-pay | Admitting: Family Medicine

## 2024-08-20 ENCOUNTER — Ambulatory Visit: Payer: Self-pay | Admitting: Family Medicine

## 2024-08-20 VITALS — BP 105/64 | HR 71 | Ht 64.0 in | Wt 156.0 lb

## 2024-08-20 DIAGNOSIS — I498 Other specified cardiac arrhythmias: Secondary | ICD-10-CM | POA: Diagnosis not present

## 2024-08-20 DIAGNOSIS — E66811 Obesity, class 1: Secondary | ICD-10-CM

## 2024-08-20 DIAGNOSIS — G2581 Restless legs syndrome: Secondary | ICD-10-CM

## 2024-08-20 DIAGNOSIS — R252 Cramp and spasm: Secondary | ICD-10-CM

## 2024-08-20 DIAGNOSIS — F331 Major depressive disorder, recurrent, moderate: Secondary | ICD-10-CM

## 2024-08-20 LAB — URINALYSIS, COMPLETE
Bilirubin, UA: NEGATIVE
Glucose, UA: NEGATIVE
Ketones, UA: NEGATIVE
Leukocytes,UA: NEGATIVE
Nitrite, UA: NEGATIVE
Protein,UA: NEGATIVE
RBC, UA: NEGATIVE
Specific Gravity, UA: 1.015 (ref 1.005–1.030)
Urobilinogen, Ur: 0.2 mg/dL (ref 0.2–1.0)
pH, UA: 7.5 (ref 5.0–7.5)

## 2024-08-20 LAB — MICROSCOPIC EXAMINATION
Bacteria, UA: NONE SEEN
Epithelial Cells (non renal): NONE SEEN /HPF (ref 0–10)
RBC, Urine: NONE SEEN /HPF (ref 0–2)
Renal Epithel, UA: NONE SEEN /HPF
WBC, UA: NONE SEEN /HPF (ref 0–5)
Yeast, UA: NONE SEEN

## 2024-08-20 MED ORDER — PHENTERMINE HCL 37.5 MG PO CAPS: 37.5000 mg | ORAL_CAPSULE | ORAL | 1 refills | Status: AC

## 2024-08-20 MED ORDER — ROPINIROLE HCL 1 MG PO TABS: 1.0000 mg | ORAL_TABLET | Freq: Every day | ORAL | 5 refills | Status: DC

## 2024-08-20 MED ORDER — DULOXETINE HCL 30 MG PO CPEP: 30.0000 mg | ORAL_CAPSULE | Freq: Every day | ORAL | 0 refills | Status: AC

## 2024-08-20 MED ORDER — TIZANIDINE HCL 4 MG PO TABS: 4.0000 mg | ORAL_TABLET | Freq: Every day | ORAL | 0 refills | Status: DC
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
## 2024-08-21 ENCOUNTER — Ambulatory Visit: Payer: Self-pay | Admitting: Family Medicine

## 2024-08-21 ENCOUNTER — Encounter: Payer: Self-pay | Admitting: Family Medicine

## 2024-08-21 NOTE — Progress Notes (Signed)
Hello Catheleen,  Your lab result is normal and/or stable.Some minor variations that are not significant are commonly marked abnormal, but do not represent any medical problem for you.  Best regards, Micheline Markes, M.D.

## 2024-08-29 ENCOUNTER — Emergency Department (HOSPITAL_COMMUNITY)
Admission: EM | Admit: 2024-08-29 | Discharge: 2024-08-30 | Disposition: A | Discharge status: Home / Self Care | Attending: Emergency Medicine | Admitting: Emergency Medicine

## 2024-08-29 ENCOUNTER — Emergency Department (HOSPITAL_COMMUNITY)

## 2024-08-29 ENCOUNTER — Other Ambulatory Visit: Payer: Self-pay

## 2024-08-29 ENCOUNTER — Encounter (HOSPITAL_COMMUNITY): Payer: Self-pay

## 2024-08-29 DIAGNOSIS — G9389 Other specified disorders of brain: Secondary | ICD-10-CM | POA: Diagnosis not present

## 2024-08-29 DIAGNOSIS — M25521 Pain in right elbow: Secondary | ICD-10-CM | POA: Diagnosis not present

## 2024-08-29 DIAGNOSIS — M47812 Spondylosis without myelopathy or radiculopathy, cervical region: Secondary | ICD-10-CM | POA: Diagnosis not present

## 2024-08-29 DIAGNOSIS — J449 Chronic obstructive pulmonary disease, unspecified: Secondary | ICD-10-CM | POA: Insufficient documentation

## 2024-08-29 DIAGNOSIS — J342 Deviated nasal septum: Secondary | ICD-10-CM | POA: Insufficient documentation

## 2024-08-29 DIAGNOSIS — S060X0A Concussion without loss of consciousness, initial encounter: Secondary | ICD-10-CM | POA: Insufficient documentation

## 2024-08-29 DIAGNOSIS — S02119A Unspecified fracture of occiput, initial encounter for closed fracture: Secondary | ICD-10-CM | POA: Insufficient documentation

## 2024-08-29 DIAGNOSIS — M542 Cervicalgia: Secondary | ICD-10-CM | POA: Insufficient documentation

## 2024-08-29 DIAGNOSIS — R519 Headache, unspecified: Secondary | ICD-10-CM | POA: Diagnosis not present

## 2024-08-29 DIAGNOSIS — I609 Nontraumatic subarachnoid hemorrhage, unspecified: Secondary | ICD-10-CM

## 2024-08-29 DIAGNOSIS — S066X0A Traumatic subarachnoid hemorrhage without loss of consciousness, initial encounter: Secondary | ICD-10-CM | POA: Insufficient documentation

## 2024-08-29 LAB — CBC WITH DIFFERENTIAL/PLATELET
Abs Immature Granulocytes: 0.07 K/uL (ref 0.00–0.07)
Basophils Absolute: 0.1 K/uL (ref 0.0–0.1)
Basophils Relative: 0 %
Eosinophils Absolute: 0 K/uL (ref 0.0–0.5)
Eosinophils Relative: 0 %
HCT: 42.7 % (ref 36.0–46.0)
Hemoglobin: 14.4 g/dL (ref 12.0–15.0)
Immature Granulocytes: 1 %
Lymphocytes Relative: 7 %
Lymphs Abs: 0.9 K/uL (ref 0.7–4.0)
MCH: 32.3 pg (ref 26.0–34.0)
MCHC: 33.7 g/dL (ref 30.0–36.0)
MCV: 95.7 fL (ref 80.0–100.0)
Monocytes Absolute: 0.4 K/uL (ref 0.1–1.0)
Monocytes Relative: 3 %
Neutro Abs: 11.8 K/uL — ABNORMAL HIGH (ref 1.7–7.7)
Neutrophils Relative %: 89 %
Platelets: 179 K/uL (ref 150–400)
RBC: 4.46 MIL/uL (ref 3.87–5.11)
RDW: 12.9 % (ref 11.5–15.5)
WBC: 13.3 K/uL — ABNORMAL HIGH (ref 4.0–10.5)
nRBC: 0 % (ref 0.0–0.2)

## 2024-08-29 LAB — BASIC METABOLIC PANEL WITH GFR
Anion gap: 9 (ref 5–15)
BUN: 16 mg/dL (ref 8–23)
CO2: 30 mmol/L (ref 22–32)
Calcium: 10 mg/dL (ref 8.9–10.3)
Chloride: 102 mmol/L (ref 98–111)
Creatinine, Ser: 0.71 mg/dL (ref 0.44–1.00)
GFR, Estimated: >60 mL/min (ref >60)
Glucose, Bld: 105 mg/dL — ABNORMAL HIGH (ref 70–99)
Potassium: 4.2 mmol/L (ref 3.5–5.1)
Sodium: 141 mmol/L (ref 135–145)

## 2024-08-29 LAB — PROTIME-INR
INR: 0.9 (ref 0.8–1.2)
Prothrombin Time: 12.7 s (ref 11.4–15.2)

## 2024-08-29 MED ORDER — DIPHENHYDRAMINE HCL 50 MG/ML IJ SOLN
25.0000 mg | Freq: Once | INTRAMUSCULAR | Status: AC
  Administered 2024-08-29: 21:00:00 25 mg via INTRAVENOUS
  Filled 2024-08-29: qty 1

## 2024-08-29 MED ORDER — ONDANSETRON 4 MG PO TBDP
4.0000 mg | ORAL_TABLET | Freq: Once | ORAL | Status: AC
  Administered 2024-08-29: 17:00:00 4 mg via ORAL
  Filled 2024-08-29: qty 1

## 2024-08-29 MED ORDER — OXYCODONE-ACETAMINOPHEN 5-325 MG PO TABS
1.0000 | ORAL_TABLET | Freq: Once | ORAL | Status: AC
  Administered 2024-08-29: 17:00:00 1 via ORAL
  Filled 2024-08-29: qty 1

## 2024-08-29 MED ORDER — SODIUM CHLORIDE 0.9 % IV SOLN
12.5000 mg | Freq: Once | INTRAVENOUS | Status: AC
  Administered 2024-08-29: 18:00:00 12.5 mg via INTRAVENOUS
  Filled 2024-08-29: qty 0.5

## 2024-08-29 MED ORDER — PROCHLORPERAZINE EDISYLATE 10 MG/2ML IJ SOLN
10.0000 mg | Freq: Once | INTRAMUSCULAR | Status: AC
  Administered 2024-08-29: 21:00:00 10 mg via INTRAVENOUS
  Filled 2024-08-29: qty 2

## 2024-08-29 NOTE — ED Provider Notes (Cosign Needed)
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 morbidity.  The differential diagnosis includes ICH, concussion, fracture, dislocation, intraabdominal or intrathoracic traumatic injury   Co morbidities that complicate the patient evaluation  Hx of SAH. See HPI   Additional history obtained:  Additional history obtained from  EMS and Nursing   External records from outside source obtained and reviewed including triage RN note   Lab Tests:  I Ordered, and personally interpreted labs.  The pertinent results include:   WBC 13.3   Imaging Studies ordered:  I ordered imaging studies including CT head, cervical spine, maxillofacial, right elbow I independently visualized and interpreted imaging which showed  Mild to moderate extra-axial air over the right convexity most prominent over the right frontal region likely subdural in location. No significant mass effect or midline shift. Small focus of acute hemorrhage along the most peripheral right inferolateral surface of the right cerebellar hemisphere 4mm in thickness possibly subarachnoid in  location. Small 6-54mm focus of acute hemorrhage in the region of the right cerebellopontine angle. Possible nondisplaced right occipital skull fracture extending to the inferior aspect of the right mastoid sinus. No evidence of facial bone fracture. No acute fracture of the cervical spine. Mild spondylosis throughout the cervical spine with disc disease at the C5-6 level. Multilevel neural foraminal narrowing I agree with the radiologist interpretation  Medicines ordered and prescription drug management:  I ordered medication including compazine , benadryl , zofran , percocet, phenergan , zofran   for pain, nv  Reevaluation of the patient after these medicines showed that the patient improved I have reviewed the patients home medicines and have made adjustments as needed    Critical Interventions:  Traumatic SAH   Consultations Obtained:  I requested consultation with neurosurgery Dr. Orlean,  and discussed lab and imaging findings as well as pertinent plan - they recommend:  Repeat CT in 6 hrs If no worsening can be DC If worsening may need reconsult to neurosurgery and consider admission  If intractable vomiting or other medical complaint, may need admission to medicine service   Problem List / ED Course:  Head injury Concussion SAH Concern for concussion, ICH with active vomiting, HA following injury. No LOC  Pt has hx of SAH and is not on thinners Neurologically intact with no visual deficits. No complaints of chest pain, abdominal pain.  No ecchymosis to chest or abdomen on my exam.  Low suspicion for intrathoracic, intra-abdominal traumatic injury. No complaints of back pain nor spinal tenderness to thoracic nor lumbar spine.  Low suspicion for traumatic injury to spine. CT notable for small SAH. Consulted neurosurgery who recommended repeat CT in 6 hours to ensure no increase in size Patient seems to have sustained a concussion. I have provided concussion clinic f/u  should patient be dc Was provided phenergan  for nausea, vomiting without significant improvement so was provided zofran  with improvement of nausea Migraine cocktail for HA  Jaw pain CT max negative for mandibular fx, dislocation No trismus  Right elbow pain ROM WNL Neurovascularly intact XR negative for fx, dislocation Percocet for pain.    Reevaluation:  After the interventions noted above, I reevaluated the patient and found that they have :stayed the same    Dispostion:  Dispo pending repeat CT imaging, possible neurosurgery f/u. Sign out to [employer] pending CT imaging. See his note. Dispo can change at discretion of next provider   Final diagnoses:  SAH (subarachnoid hemorrhage) (HCC)  Concussion without loss of consciousness, initial encounter    ED Discharge Orders     None        Minnie Tinnie BRAVO, PA 08/29/24 [id number]
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
## 2024-08-30 ENCOUNTER — Emergency Department (HOSPITAL_COMMUNITY)

## 2024-08-30 MED ORDER — ONDANSETRON 4 MG PO TBDP: 4.0000 mg | ORAL_TABLET | Freq: Three times a day (TID) | ORAL | 0 refills | Status: AC | PRN

## 2024-08-30 MED ORDER — OXYCODONE-ACETAMINOPHEN 5-325 MG PO TABS: 1.0000 | ORAL_TABLET | Freq: Four times a day (QID) | ORAL | 0 refills | Status: DC | PRN

## 2024-08-30 NOTE — ED Notes (Signed)
 Pt refuses to keep monitoring leads on, pt has been educated for purpose, but continues to remove the cords off.
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
## 2024-09-17 ENCOUNTER — Encounter: Payer: Self-pay | Admitting: Family Medicine

## 2024-09-18 ENCOUNTER — Encounter: Payer: Self-pay | Admitting: Family Medicine

## 2024-09-18 ENCOUNTER — Other Ambulatory Visit: Payer: Self-pay | Admitting: Nurse Practitioner

## 2024-09-18 DIAGNOSIS — G2581 Restless legs syndrome: Secondary | ICD-10-CM

## 2024-09-18 MED ORDER — TIZANIDINE HCL 4 MG PO TABS: 4.0000 mg | ORAL_TABLET | Freq: Every day | ORAL | 0 refills | Status: DC

## 2024-09-20 ENCOUNTER — Other Ambulatory Visit: Payer: Self-pay | Admitting: Neurosurgery

## 2024-09-20 DIAGNOSIS — I609 Nontraumatic subarachnoid hemorrhage, unspecified: Secondary | ICD-10-CM

## 2024-09-27 ENCOUNTER — Inpatient Hospital Stay: Admission: RE | Admit: 2024-09-27 | Discharge: 2024-09-27 | Attending: Neurosurgery | Admitting: Neurosurgery

## 2024-09-27 DIAGNOSIS — I609 Nontraumatic subarachnoid hemorrhage, unspecified: Secondary | ICD-10-CM

## 2024-10-04 ENCOUNTER — Ambulatory Visit: Admitting: Family Medicine

## 2024-10-04 ENCOUNTER — Encounter: Payer: Self-pay | Admitting: Family Medicine

## 2024-10-04 VITALS — BP 132/81 | HR 94 | Temp 97.5°F | Ht 64.0 in | Wt 153.0 lb

## 2024-10-04 DIAGNOSIS — I609 Nontraumatic subarachnoid hemorrhage, unspecified: Secondary | ICD-10-CM

## 2024-10-04 DIAGNOSIS — S069X1D Unspecified intracranial injury with loss of consciousness of 30 minutes or less, subsequent encounter: Secondary | ICD-10-CM

## 2024-10-04 DIAGNOSIS — S069X1A Unspecified intracranial injury with loss of consciousness of 30 minutes or less, initial encounter: Secondary | ICD-10-CM

## 2024-10-04 DIAGNOSIS — G2581 Restless legs syndrome: Secondary | ICD-10-CM

## 2024-10-04 DIAGNOSIS — S060X1A Concussion with loss of consciousness of 30 minutes or less, initial encounter: Secondary | ICD-10-CM

## 2024-10-04 DIAGNOSIS — S0291XA Unspecified fracture of skull, initial encounter for closed fracture: Secondary | ICD-10-CM

## 2024-10-04 MED ORDER — ONDANSETRON HCL 4 MG PO TABS: 4.0000 mg | ORAL_TABLET | Freq: Three times a day (TID) | ORAL | 0 refills | Status: AC | PRN

## 2024-10-04 MED ORDER — ACETAMINOPHEN-CODEINE 300-30 MG PO TABS: 1.0000 | ORAL_TABLET | ORAL | 0 refills | Status: AC | PRN

## 2024-10-04 MED ORDER — TIZANIDINE HCL 4 MG PO TABS: 4.0000 mg | ORAL_TABLET | Freq: Every day | ORAL | 0 refills | Status: AC

## 2024-10-08 ENCOUNTER — Encounter: Payer: Self-pay | Admitting: Family Medicine
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
## 2024-10-18 ENCOUNTER — Ambulatory Visit: Admitting: Family Medicine

## 2024-10-18 ENCOUNTER — Encounter: Payer: Self-pay | Admitting: Family Medicine

## 2024-10-18 VITALS — BP 106/70 | HR 78 | Temp 97.9°F | Ht 64.0 in | Wt 149.5 lb

## 2024-10-18 DIAGNOSIS — I609 Nontraumatic subarachnoid hemorrhage, unspecified: Secondary | ICD-10-CM

## 2024-10-18 DIAGNOSIS — S060X1A Concussion with loss of consciousness of 30 minutes or less, initial encounter: Secondary | ICD-10-CM | POA: Insufficient documentation
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
## 2024-10-31 ENCOUNTER — Ambulatory Visit: Admitting: Family Medicine

## 2024-11-19 ENCOUNTER — Ambulatory Visit: Admitting: Family Medicine
